# Patient Record
Sex: Female | Born: 1950 | Race: White | Hispanic: No | Smoking: Never smoker
Health system: Southern US, Community
[De-identification: ages and names within clinical notes are randomized; demographics above are authoritative.]

## PROBLEM LIST (undated history)

## (undated) DIAGNOSIS — C499 Malignant neoplasm of connective and soft tissue, unspecified: Secondary | ICD-10-CM

## (undated) DIAGNOSIS — F419 Anxiety disorder, unspecified: Secondary | ICD-10-CM

## (undated) DIAGNOSIS — Z1211 Encounter for screening for malignant neoplasm of colon: Secondary | ICD-10-CM

## (undated) DIAGNOSIS — I499 Cardiac arrhythmia, unspecified: Secondary | ICD-10-CM

## (undated) DIAGNOSIS — E2839 Other primary ovarian failure: Secondary | ICD-10-CM

## (undated) DIAGNOSIS — F32A Depression, unspecified: Secondary | ICD-10-CM

## (undated) DIAGNOSIS — D688 Other specified coagulation defects: Secondary | ICD-10-CM

## (undated) DIAGNOSIS — B009 Herpesviral infection, unspecified: Secondary | ICD-10-CM

## (undated) DIAGNOSIS — I749 Embolism and thrombosis of unspecified artery: Secondary | ICD-10-CM

## (undated) DIAGNOSIS — F329 Major depressive disorder, single episode, unspecified: Secondary | ICD-10-CM

## (undated) DIAGNOSIS — R011 Cardiac murmur, unspecified: Secondary | ICD-10-CM

## (undated) HISTORY — DX: Anxiety disorder, unspecified: F41.9

## (undated) HISTORY — DX: Cardiac arrhythmia, unspecified: I49.9

## (undated) HISTORY — DX: Depression, unspecified: F32.A

## (undated) HISTORY — DX: Cardiac murmur, unspecified: R01.1

## (undated) HISTORY — DX: Encounter for screening for malignant neoplasm of colon: Z12.11

## (undated) HISTORY — DX: Major depressive disorder, single episode, unspecified: F32.9

## (undated) HISTORY — DX: Other specified coagulation defects: D68.8

## (undated) HISTORY — DX: Herpesviral infection, unspecified: B00.9

## (undated) HISTORY — DX: Malignant neoplasm of connective and soft tissue, unspecified: C49.9

## (undated) HISTORY — PX: OTHER SURGICAL HISTORY: SHX169

## (undated) HISTORY — DX: Embolism and thrombosis of unspecified artery: I74.9

## (undated) HISTORY — DX: Other primary ovarian failure: E28.39

## (undated) HISTORY — PX: BREAST SURGERY: SHX581

## (undated) HISTORY — PX: AUGMENTATION MAMMAPLASTY: SUR837

---

## 1993-03-23 HISTORY — PX: VAGINAL HYSTERECTOMY: SUR661

## 1997-07-18 ENCOUNTER — Other Ambulatory Visit: Admission: RE | Admit: 1997-07-18 | Discharge: 1997-07-18 | Payer: Self-pay | Admitting: *Deleted

## 1998-05-30 ENCOUNTER — Other Ambulatory Visit: Admission: RE | Admit: 1998-05-30 | Discharge: 1998-05-30 | Payer: Self-pay | Admitting: *Deleted

## 1999-06-25 ENCOUNTER — Other Ambulatory Visit: Admission: RE | Admit: 1999-06-25 | Discharge: 1999-06-25 | Payer: Self-pay | Admitting: *Deleted

## 1999-07-24 ENCOUNTER — Encounter: Admission: RE | Admit: 1999-07-24 | Discharge: 1999-07-24 | Payer: Self-pay | Admitting: Oncology

## 1999-07-24 ENCOUNTER — Encounter: Payer: Self-pay | Admitting: Oncology

## 1999-08-21 ENCOUNTER — Encounter: Admission: RE | Admit: 1999-08-21 | Discharge: 1999-08-21 | Payer: Self-pay | Admitting: Oncology

## 1999-08-21 ENCOUNTER — Encounter: Payer: Self-pay | Admitting: Oncology

## 2000-06-15 ENCOUNTER — Other Ambulatory Visit: Admission: RE | Admit: 2000-06-15 | Discharge: 2000-06-15 | Payer: Self-pay | Admitting: *Deleted

## 2000-07-20 ENCOUNTER — Encounter: Payer: Self-pay | Admitting: Oncology

## 2000-07-20 ENCOUNTER — Encounter: Admission: RE | Admit: 2000-07-20 | Discharge: 2000-07-20 | Payer: Self-pay | Admitting: Oncology

## 2001-07-06 ENCOUNTER — Encounter: Payer: Self-pay | Admitting: Oncology

## 2001-07-06 ENCOUNTER — Encounter: Admission: RE | Admit: 2001-07-06 | Discharge: 2001-07-06 | Payer: Self-pay | Admitting: Oncology

## 2002-06-13 ENCOUNTER — Other Ambulatory Visit: Admission: RE | Admit: 2002-06-13 | Discharge: 2002-06-13 | Payer: Self-pay | Admitting: Obstetrics and Gynecology

## 2002-10-02 ENCOUNTER — Encounter: Payer: Self-pay | Admitting: Oncology

## 2002-10-02 ENCOUNTER — Encounter: Admission: RE | Admit: 2002-10-02 | Discharge: 2002-10-02 | Payer: Self-pay | Admitting: Oncology

## 2003-07-30 ENCOUNTER — Other Ambulatory Visit: Admission: RE | Admit: 2003-07-30 | Discharge: 2003-07-30 | Payer: Self-pay | Admitting: Gynecology

## 2003-09-25 ENCOUNTER — Encounter: Admission: RE | Admit: 2003-09-25 | Discharge: 2003-09-25 | Payer: Self-pay | Admitting: Oncology

## 2003-12-05 ENCOUNTER — Encounter: Admission: RE | Admit: 2003-12-05 | Discharge: 2003-12-05 | Payer: Self-pay | Admitting: Gastroenterology

## 2003-12-20 ENCOUNTER — Inpatient Hospital Stay (HOSPITAL_COMMUNITY): Admission: EM | Admit: 2003-12-20 | Discharge: 2003-12-24 | Payer: Self-pay | Admitting: *Deleted

## 2003-12-23 ENCOUNTER — Encounter: Payer: Self-pay | Admitting: Oncology

## 2004-01-22 ENCOUNTER — Ambulatory Visit: Payer: Self-pay | Admitting: Oncology

## 2004-03-11 ENCOUNTER — Ambulatory Visit: Payer: Self-pay | Admitting: Oncology

## 2004-03-23 DIAGNOSIS — B009 Herpesviral infection, unspecified: Secondary | ICD-10-CM

## 2004-03-23 HISTORY — DX: Herpesviral infection, unspecified: B00.9

## 2004-05-19 ENCOUNTER — Ambulatory Visit: Payer: Self-pay | Admitting: Oncology

## 2004-07-04 ENCOUNTER — Ambulatory Visit: Payer: Self-pay | Admitting: Oncology

## 2004-08-20 ENCOUNTER — Other Ambulatory Visit: Admission: RE | Admit: 2004-08-20 | Discharge: 2004-08-20 | Payer: Self-pay | Admitting: Gynecology

## 2004-08-28 ENCOUNTER — Ambulatory Visit: Payer: Self-pay | Admitting: Oncology

## 2004-10-22 ENCOUNTER — Ambulatory Visit: Payer: Self-pay | Admitting: Oncology

## 2004-12-29 ENCOUNTER — Ambulatory Visit: Payer: Self-pay | Admitting: Oncology

## 2005-03-03 ENCOUNTER — Ambulatory Visit: Payer: Self-pay | Admitting: Oncology

## 2005-05-25 ENCOUNTER — Ambulatory Visit: Payer: Self-pay | Admitting: Oncology

## 2005-06-02 ENCOUNTER — Ambulatory Visit (HOSPITAL_COMMUNITY): Admission: RE | Admit: 2005-06-02 | Discharge: 2005-06-02 | Payer: Self-pay | Admitting: Oncology

## 2005-06-24 LAB — CBC WITH DIFFERENTIAL/PLATELET
BASO%: 1.8 % (ref 0.0–2.0)
Basophils Absolute: 0.1 10*3/uL (ref 0.0–0.1)
EOS%: 3.9 % (ref 0.0–7.0)
Eosinophils Absolute: 0.2 10*3/uL (ref 0.0–0.5)
HCT: 38.4 % (ref 34.8–46.6)
HGB: 12.8 g/dL (ref 11.6–15.9)
LYMPH%: 39.4 % (ref 14.0–48.0)
MCH: 29.5 pg (ref 26.0–34.0)
MCHC: 33.4 g/dL (ref 32.0–36.0)
MCV: 88.2 fL (ref 81.0–101.0)
MONO#: 0.5 10*3/uL (ref 0.1–0.9)
MONO%: 9.1 % (ref 0.0–13.0)
NEUT#: 2.4 10*3/uL (ref 1.5–6.5)
NEUT%: 45.8 % (ref 39.6–76.8)
Platelets: 185 10*3/uL (ref 145–400)
RBC: 4.35 10*6/uL (ref 3.70–5.32)
RDW: 12.7 % (ref 11.3–14.5)
WBC: 5.1 10*3/uL (ref 3.9–10.0)
lymph#: 2 10*3/uL (ref 0.9–3.3)

## 2005-06-24 LAB — PROTIME-INR
INR: 1.9 (ref 2.00–3.50)
Protime: 16.9 Seconds (ref 10.6–13.4)

## 2005-07-06 LAB — CBC WITH DIFFERENTIAL/PLATELET
BASO%: 1.7 % (ref 0.0–2.0)
Basophils Absolute: 0.1 10*3/uL (ref 0.0–0.1)
EOS%: 3.1 % (ref 0.0–7.0)
Eosinophils Absolute: 0.2 10*3/uL (ref 0.0–0.5)
HCT: 39.4 % (ref 34.8–46.6)
HGB: 13.1 g/dL (ref 11.6–15.9)
LYMPH%: 32.4 % (ref 14.0–48.0)
MCH: 29.4 pg (ref 26.0–34.0)
MCHC: 33.3 g/dL (ref 32.0–36.0)
MCV: 88.1 fL (ref 81.0–101.0)
MONO#: 0.5 10*3/uL (ref 0.1–0.9)
MONO%: 8.8 % (ref 0.0–13.0)
NEUT#: 2.8 10*3/uL (ref 1.5–6.5)
NEUT%: 54.1 % (ref 39.6–76.8)
Platelets: 189 10*3/uL (ref 145–400)
RBC: 4.47 10*6/uL (ref 3.70–5.32)
RDW: 13 % (ref 11.3–14.5)
WBC: 5.2 10*3/uL (ref 3.9–10.0)
lymph#: 1.7 10*3/uL (ref 0.9–3.3)

## 2005-07-06 LAB — PROTIME-INR
INR: 3 (ref 2.00–3.50)
Protime: 20.9 Seconds — ABNORMAL HIGH (ref 10.6–13.4)

## 2005-07-31 ENCOUNTER — Ambulatory Visit: Payer: Self-pay | Admitting: Oncology

## 2005-08-04 LAB — PROTIME-INR
INR: 2.4 (ref 2.00–3.50)
Protime: 18.7 Seconds — ABNORMAL HIGH (ref 10.6–13.4)

## 2005-08-04 LAB — CBC WITH DIFFERENTIAL/PLATELET
BASO%: 1.8 % (ref 0.0–2.0)
Basophils Absolute: 0.1 10*3/uL (ref 0.0–0.1)
EOS%: 4.5 % (ref 0.0–7.0)
Eosinophils Absolute: 0.3 10*3/uL (ref 0.0–0.5)
HCT: 40.5 % (ref 34.8–46.6)
HGB: 13.8 g/dL (ref 11.6–15.9)
LYMPH%: 32.3 % (ref 14.0–48.0)
MCH: 29.7 pg (ref 26.0–34.0)
MCHC: 34 g/dL (ref 32.0–36.0)
MCV: 87.4 fL (ref 81.0–101.0)
MONO#: 0.5 10*3/uL (ref 0.1–0.9)
MONO%: 8 % (ref 0.0–13.0)
NEUT#: 3.2 10*3/uL (ref 1.5–6.5)
NEUT%: 53.3 % (ref 39.6–76.8)
Platelets: 181 10*3/uL (ref 145–400)
RBC: 4.63 10*6/uL (ref 3.70–5.32)
RDW: 12.5 % (ref 11.3–14.5)
WBC: 6.1 10*3/uL (ref 3.9–10.0)
lymph#: 2 10*3/uL (ref 0.9–3.3)

## 2005-08-05 ENCOUNTER — Encounter: Admission: RE | Admit: 2005-08-05 | Discharge: 2005-08-05 | Payer: Self-pay | Admitting: Oncology

## 2005-08-21 ENCOUNTER — Other Ambulatory Visit: Admission: RE | Admit: 2005-08-21 | Discharge: 2005-08-21 | Payer: Self-pay | Admitting: Gynecology

## 2005-09-01 LAB — CBC WITH DIFFERENTIAL/PLATELET
BASO%: 2 % (ref 0.0–2.0)
Basophils Absolute: 0.1 10*3/uL (ref 0.0–0.1)
EOS%: 5.2 % (ref 0.0–7.0)
Eosinophils Absolute: 0.3 10*3/uL (ref 0.0–0.5)
HCT: 41 % (ref 34.8–46.6)
HGB: 13.4 g/dL (ref 11.6–15.9)
LYMPH%: 38.2 % (ref 14.0–48.0)
MCH: 28.4 pg (ref 26.0–34.0)
MCHC: 32.6 g/dL (ref 32.0–36.0)
MCV: 87.3 fL (ref 81.0–101.0)
MONO#: 0.5 10*3/uL (ref 0.1–0.9)
MONO%: 9.5 % (ref 0.0–13.0)
NEUT#: 2.5 10*3/uL (ref 1.5–6.5)
NEUT%: 45.1 % (ref 39.6–76.8)
Platelets: 194 10*3/uL (ref 145–400)
RBC: 4.7 10*6/uL (ref 3.70–5.32)
RDW: 12.4 % (ref 11.3–14.5)
WBC: 5.6 10*3/uL (ref 3.9–10.0)
lymph#: 2.1 10*3/uL (ref 0.9–3.3)

## 2005-09-01 LAB — PROTIME-INR
INR: 1.9 — ABNORMAL LOW (ref 2.00–3.50)
Protime: 16.8 Seconds — ABNORMAL HIGH (ref 10.6–13.4)

## 2005-09-07 LAB — PROTIME-INR
INR: 2 (ref 2.00–3.50)
Protime: 17.6 Seconds — ABNORMAL HIGH (ref 10.6–13.4)

## 2005-09-15 ENCOUNTER — Ambulatory Visit: Payer: Self-pay | Admitting: Oncology

## 2005-09-21 LAB — PROTIME-INR
INR: 1.8 — ABNORMAL LOW (ref 2.00–3.50)
Protime: 16.5 Seconds — ABNORMAL HIGH (ref 10.6–13.4)

## 2005-09-29 LAB — CBC WITH DIFFERENTIAL/PLATELET
BASO%: 1.6 % (ref 0.0–2.0)
Basophils Absolute: 0.1 10*3/uL (ref 0.0–0.1)
EOS%: 3.3 % (ref 0.0–7.0)
Eosinophils Absolute: 0.2 10*3/uL (ref 0.0–0.5)
HCT: 41.4 % (ref 34.8–46.6)
HGB: 13.6 g/dL (ref 11.6–15.9)
LYMPH%: 27.1 % (ref 14.0–48.0)
MCH: 28.7 pg (ref 26.0–34.0)
MCHC: 32.9 g/dL (ref 32.0–36.0)
MCV: 87.3 fL (ref 81.0–101.0)
MONO#: 0.6 10*3/uL (ref 0.1–0.9)
MONO%: 10 % (ref 0.0–13.0)
NEUT#: 3.3 10*3/uL (ref 1.5–6.5)
NEUT%: 58 % (ref 39.6–76.8)
Platelets: 182 10*3/uL (ref 145–400)
RBC: 4.74 10*6/uL (ref 3.70–5.32)
RDW: 12.8 % (ref 11.3–14.5)
WBC: 5.7 10*3/uL (ref 3.9–10.0)
lymph#: 1.6 10*3/uL (ref 0.9–3.3)

## 2005-09-29 LAB — PROTIME-INR
INR: 2.6 (ref 2.00–3.50)
Protime: 19.5 Seconds — ABNORMAL HIGH (ref 10.6–13.4)

## 2005-10-27 LAB — CBC WITH DIFFERENTIAL/PLATELET
BASO%: 1.7 % (ref 0.0–2.0)
Basophils Absolute: 0.1 10*3/uL (ref 0.0–0.1)
EOS%: 3.1 % (ref 0.0–7.0)
Eosinophils Absolute: 0.2 10*3/uL (ref 0.0–0.5)
HCT: 42.5 % (ref 34.8–46.6)
HGB: 13.8 g/dL (ref 11.6–15.9)
LYMPH%: 36.8 % (ref 14.0–48.0)
MCH: 28.4 pg (ref 26.0–34.0)
MCHC: 32.4 g/dL (ref 32.0–36.0)
MCV: 87.9 fL (ref 81.0–101.0)
MONO#: 0.6 10*3/uL (ref 0.1–0.9)
MONO%: 11 % (ref 0.0–13.0)
NEUT#: 2.5 10*3/uL (ref 1.5–6.5)
NEUT%: 47.3 % (ref 39.6–76.8)
Platelets: 195 10*3/uL (ref 145–400)
RBC: 4.83 10*6/uL (ref 3.70–5.32)
RDW: 12.6 % (ref 11.3–14.5)
WBC: 5.3 10*3/uL (ref 3.9–10.0)
lymph#: 2 10*3/uL (ref 0.9–3.3)

## 2005-10-27 LAB — PROTIME-INR
INR: 3 (ref 2.00–3.50)
Protime: 20.9 Seconds (ref 10.6–13.4)

## 2005-11-19 ENCOUNTER — Ambulatory Visit: Payer: Self-pay | Admitting: Oncology

## 2005-11-24 LAB — COMPREHENSIVE METABOLIC PANEL
ALT: 20 U/L (ref 0–40)
AST: 24 U/L (ref 0–37)
Albumin: 4.9 g/dL (ref 3.5–5.2)
Alkaline Phosphatase: 45 U/L (ref 39–117)
BUN: 16 mg/dL (ref 6–23)
CO2: 27 mEq/L (ref 19–32)
Calcium: 9.4 mg/dL (ref 8.4–10.5)
Chloride: 102 mEq/L (ref 96–112)
Creatinine, Ser: 0.6 mg/dL (ref 0.40–1.20)
Glucose, Bld: 100 mg/dL — ABNORMAL HIGH (ref 70–99)
Potassium: 4.1 mEq/L (ref 3.5–5.3)
Sodium: 140 mEq/L (ref 135–145)
Total Bilirubin: 0.8 mg/dL (ref 0.3–1.2)
Total Protein: 7.3 g/dL (ref 6.0–8.3)

## 2005-11-24 LAB — CBC WITH DIFFERENTIAL/PLATELET
BASO%: 0.7 % (ref 0.0–2.0)
Basophils Absolute: 0 10*3/uL (ref 0.0–0.1)
EOS%: 3.3 % (ref 0.0–7.0)
Eosinophils Absolute: 0.2 10*3/uL (ref 0.0–0.5)
HCT: 38.6 % (ref 34.8–46.6)
HGB: 13.1 g/dL (ref 11.6–15.9)
LYMPH%: 37.9 % (ref 14.0–48.0)
MCH: 29.3 pg (ref 26.0–34.0)
MCHC: 33.9 g/dL (ref 32.0–36.0)
MCV: 86.6 fL (ref 81.0–101.0)
MONO#: 0.4 10*3/uL (ref 0.1–0.9)
MONO%: 9.1 % (ref 0.0–13.0)
NEUT#: 2.2 10*3/uL (ref 1.5–6.5)
NEUT%: 49 % (ref 39.6–76.8)
Platelets: 207 10*3/uL (ref 145–400)
RBC: 4.46 10*6/uL (ref 3.70–5.32)
RDW: 13.9 % (ref 11.3–14.5)
WBC: 4.5 10*3/uL (ref 3.9–10.0)
lymph#: 1.7 10*3/uL (ref 0.9–3.3)

## 2005-11-24 LAB — PROTIME-INR
INR: 3.2 (ref 2.00–3.50)
Protime: 38.4 Seconds — ABNORMAL HIGH (ref 10.6–13.4)

## 2005-11-24 LAB — LACTATE DEHYDROGENASE: LDH: 158 U/L (ref 94–250)

## 2005-12-08 LAB — PROTIME-INR
INR: 2.6 (ref 2.00–3.50)
Protime: 31.2 Seconds — ABNORMAL HIGH (ref 10.6–13.4)

## 2005-12-08 LAB — CBC WITH DIFFERENTIAL/PLATELET
BASO%: 1.9 % (ref 0.0–2.0)
Basophils Absolute: 0.1 10*3/uL (ref 0.0–0.1)
EOS%: 3 % (ref 0.0–7.0)
Eosinophils Absolute: 0.2 10*3/uL (ref 0.0–0.5)
HCT: 41 % (ref 34.8–46.6)
HGB: 13.4 g/dL (ref 11.6–15.9)
LYMPH%: 30.4 % (ref 14.0–48.0)
MCH: 28.6 pg (ref 26.0–34.0)
MCHC: 32.6 g/dL (ref 32.0–36.0)
MCV: 87.7 fL (ref 81.0–101.0)
MONO#: 0.5 10*3/uL (ref 0.1–0.9)
MONO%: 10.4 % (ref 0.0–13.0)
NEUT#: 2.9 10*3/uL (ref 1.5–6.5)
NEUT%: 54.3 % (ref 39.6–76.8)
Platelets: 203 10*3/uL (ref 145–400)
RBC: 4.68 10*6/uL (ref 3.70–5.32)
RDW: 12.4 % (ref 11.3–14.5)
WBC: 5.3 10*3/uL (ref 3.9–10.0)
lymph#: 1.6 10*3/uL (ref 0.9–3.3)

## 2006-01-11 ENCOUNTER — Ambulatory Visit: Payer: Self-pay | Admitting: Oncology

## 2006-03-05 ENCOUNTER — Ambulatory Visit: Payer: Self-pay | Admitting: Oncology

## 2006-03-09 LAB — CBC WITH DIFFERENTIAL/PLATELET
BASO%: 1.7 % (ref 0.0–2.0)
Basophils Absolute: 0.1 10*3/uL (ref 0.0–0.1)
EOS%: 2.4 % (ref 0.0–7.0)
Eosinophils Absolute: 0.2 10*3/uL (ref 0.0–0.5)
HCT: 41.7 % (ref 34.8–46.6)
HGB: 13.7 g/dL (ref 11.6–15.9)
LYMPH%: 26.9 % (ref 14.0–48.0)
MCH: 28.3 pg (ref 26.0–34.0)
MCHC: 32.8 g/dL (ref 32.0–36.0)
MCV: 86.4 fL (ref 81.0–101.0)
MONO#: 0.5 10*3/uL (ref 0.1–0.9)
MONO%: 7.3 % (ref 0.0–13.0)
NEUT#: 4.6 10*3/uL (ref 1.5–6.5)
NEUT%: 61.7 % (ref 39.6–76.8)
Platelets: 201 10*3/uL (ref 145–400)
RBC: 4.83 10*6/uL (ref 3.70–5.32)
RDW: 12.1 % (ref 11.3–14.5)
WBC: 7.4 10*3/uL (ref 3.9–10.0)
lymph#: 2 10*3/uL (ref 0.9–3.3)

## 2006-03-09 LAB — PROTIME-INR
INR: 2.4 (ref 2.00–3.50)
Protime: 28.8 Seconds — ABNORMAL HIGH (ref 10.6–13.4)

## 2006-04-29 ENCOUNTER — Ambulatory Visit: Payer: Self-pay | Admitting: Oncology

## 2006-05-04 LAB — CBC WITH DIFFERENTIAL/PLATELET
BASO%: 1.5 % (ref 0.0–2.0)
Basophils Absolute: 0.1 10*3/uL (ref 0.0–0.1)
EOS%: 2.9 % (ref 0.0–7.0)
Eosinophils Absolute: 0.2 10*3/uL (ref 0.0–0.5)
HCT: 39.9 % (ref 34.8–46.6)
HGB: 12.9 g/dL (ref 11.6–15.9)
LYMPH%: 36 % (ref 14.0–48.0)
MCH: 28.3 pg (ref 26.0–34.0)
MCHC: 32.4 g/dL (ref 32.0–36.0)
MCV: 87.2 fL (ref 81.0–101.0)
MONO#: 0.5 10*3/uL (ref 0.1–0.9)
MONO%: 8.7 % (ref 0.0–13.0)
NEUT#: 2.9 10*3/uL (ref 1.5–6.5)
NEUT%: 50.8 % (ref 39.6–76.8)
Platelets: 191 10*3/uL (ref 145–400)
RBC: 4.57 10*6/uL (ref 3.70–5.32)
RDW: 12.3 % (ref 11.3–14.5)
WBC: 5.7 10*3/uL (ref 3.9–10.0)
lymph#: 2 10*3/uL (ref 0.9–3.3)

## 2006-05-04 LAB — PROTIME-INR
INR: 1.8 — ABNORMAL LOW (ref 2.00–3.50)
Protime: 21.6 Seconds — ABNORMAL HIGH (ref 10.6–13.4)

## 2006-05-10 LAB — PROTIME-INR
INR: 2.5 (ref 2.00–3.50)
Protime: 30 Seconds — ABNORMAL HIGH (ref 10.6–13.4)

## 2006-06-01 ENCOUNTER — Ambulatory Visit (HOSPITAL_COMMUNITY): Admission: RE | Admit: 2006-06-01 | Discharge: 2006-06-01 | Payer: Self-pay | Admitting: Oncology

## 2006-06-29 ENCOUNTER — Ambulatory Visit: Payer: Self-pay | Admitting: Oncology

## 2006-06-29 LAB — CBC WITH DIFFERENTIAL/PLATELET
BASO%: 1.5 % (ref 0.0–2.0)
Basophils Absolute: 0.1 10*3/uL (ref 0.0–0.1)
EOS%: 3.1 % (ref 0.0–7.0)
Eosinophils Absolute: 0.2 10*3/uL (ref 0.0–0.5)
HCT: 38.7 % (ref 34.8–46.6)
HGB: 13.1 g/dL (ref 11.6–15.9)
LYMPH%: 38.2 % (ref 14.0–48.0)
MCH: 29 pg (ref 26.0–34.0)
MCHC: 33.8 g/dL (ref 32.0–36.0)
MCV: 85.6 fL (ref 81.0–101.0)
MONO#: 0.5 10*3/uL (ref 0.1–0.9)
MONO%: 8.9 % (ref 0.0–13.0)
NEUT#: 2.7 10*3/uL (ref 1.5–6.5)
NEUT%: 48.4 % (ref 39.6–76.8)
Platelets: 196 10*3/uL (ref 145–400)
RBC: 4.52 10*6/uL (ref 3.70–5.32)
RDW: 12.1 % (ref 11.3–14.5)
WBC: 5.5 10*3/uL (ref 3.9–10.0)
lymph#: 2.1 10*3/uL (ref 0.9–3.3)

## 2006-06-29 LAB — PROTIME-INR
INR: 2.3 (ref 2.00–3.50)
Protime: 27.6 Seconds — ABNORMAL HIGH (ref 10.6–13.4)

## 2006-08-09 ENCOUNTER — Encounter: Admission: RE | Admit: 2006-08-09 | Discharge: 2006-08-09 | Payer: Self-pay | Admitting: Oncology

## 2006-08-11 ENCOUNTER — Encounter: Admission: RE | Admit: 2006-08-11 | Discharge: 2006-08-11 | Payer: Self-pay | Admitting: Oncology

## 2006-08-20 ENCOUNTER — Ambulatory Visit: Payer: Self-pay | Admitting: Oncology

## 2006-08-23 ENCOUNTER — Other Ambulatory Visit: Admission: RE | Admit: 2006-08-23 | Discharge: 2006-08-23 | Payer: Self-pay | Admitting: Gynecology

## 2006-08-24 LAB — PROTIME-INR
INR: 2.2 (ref 2.00–3.50)
Protime: 26.4 Seconds — ABNORMAL HIGH (ref 10.6–13.4)

## 2006-08-24 LAB — CBC WITH DIFFERENTIAL/PLATELET
BASO%: 1.4 % (ref 0.0–2.0)
Basophils Absolute: 0.1 10*3/uL (ref 0.0–0.1)
EOS%: 1.4 % (ref 0.0–7.0)
Eosinophils Absolute: 0.1 10*3/uL (ref 0.0–0.5)
HCT: 40.2 % (ref 34.8–46.6)
HGB: 13.4 g/dL (ref 11.6–15.9)
LYMPH%: 31.1 % (ref 14.0–48.0)
MCH: 28.6 pg (ref 26.0–34.0)
MCHC: 33.2 g/dL (ref 32.0–36.0)
MCV: 86 fL (ref 81.0–101.0)
MONO#: 0.5 10*3/uL (ref 0.1–0.9)
MONO%: 8.5 % (ref 0.0–13.0)
NEUT#: 3.4 10*3/uL (ref 1.5–6.5)
NEUT%: 57.7 % (ref 39.6–76.8)
Platelets: 173 10*3/uL (ref 145–400)
RBC: 4.68 10*6/uL (ref 3.70–5.32)
RDW: 11.9 % (ref 11.3–14.5)
WBC: 5.8 10*3/uL (ref 3.9–10.0)
lymph#: 1.8 10*3/uL (ref 0.9–3.3)

## 2006-10-14 ENCOUNTER — Ambulatory Visit: Payer: Self-pay | Admitting: Oncology

## 2006-10-19 LAB — CBC WITH DIFFERENTIAL/PLATELET
BASO%: 2 % (ref 0.0–2.0)
Basophils Absolute: 0.1 10*3/uL (ref 0.0–0.1)
EOS%: 3.7 % (ref 0.0–7.0)
Eosinophils Absolute: 0.2 10*3/uL (ref 0.0–0.5)
HCT: 40.8 % (ref 34.8–46.6)
HGB: 13.4 g/dL (ref 11.6–15.9)
LYMPH%: 34.8 % (ref 14.0–48.0)
MCH: 28.4 pg (ref 26.0–34.0)
MCHC: 32.8 g/dL (ref 32.0–36.0)
MCV: 86.5 fL (ref 81.0–101.0)
MONO#: 0.5 10*3/uL (ref 0.1–0.9)
MONO%: 11.1 % (ref 0.0–13.0)
NEUT#: 2.3 10*3/uL (ref 1.5–6.5)
NEUT%: 48.3 % (ref 39.6–76.8)
Platelets: 175 10*3/uL (ref 145–400)
RBC: 4.72 10*6/uL (ref 3.70–5.32)
RDW: 12.1 % (ref 11.3–14.5)
WBC: 4.8 10*3/uL (ref 3.9–10.0)
lymph#: 1.7 10*3/uL (ref 0.9–3.3)

## 2006-10-19 LAB — PROTIME-INR
INR: 2.6 (ref 2.00–3.50)
Protime: 31.2 Seconds — ABNORMAL HIGH (ref 10.6–13.4)

## 2006-11-23 LAB — CBC WITH DIFFERENTIAL/PLATELET
BASO%: 0.4 % (ref 0.0–2.0)
Basophils Absolute: 0 10*3/uL (ref 0.0–0.1)
EOS%: 1.3 % (ref 0.0–7.0)
Eosinophils Absolute: 0.1 10*3/uL (ref 0.0–0.5)
HCT: 38.3 % (ref 34.8–46.6)
HGB: 13.2 g/dL (ref 11.6–15.9)
LYMPH%: 36 % (ref 14.0–48.0)
MCH: 29.3 pg (ref 26.0–34.0)
MCHC: 34.4 g/dL (ref 32.0–36.0)
MCV: 85 fL (ref 81.0–101.0)
MONO#: 0.5 10*3/uL (ref 0.1–0.9)
MONO%: 9.7 % (ref 0.0–13.0)
NEUT#: 2.8 10*3/uL (ref 1.5–6.5)
NEUT%: 52.6 % (ref 39.6–76.8)
Platelets: 190 10*3/uL (ref 145–400)
RBC: 4.51 10*6/uL (ref 3.70–5.32)
RDW: 13.6 % (ref 11.3–14.5)
WBC: 5.3 10*3/uL (ref 3.9–10.0)
lymph#: 1.9 10*3/uL (ref 0.9–3.3)

## 2006-11-23 LAB — COMPREHENSIVE METABOLIC PANEL
ALT: 16 U/L (ref 0–35)
AST: 19 U/L (ref 0–37)
Albumin: 5 g/dL (ref 3.5–5.2)
Alkaline Phosphatase: 45 U/L (ref 39–117)
BUN: 14 mg/dL (ref 6–23)
CO2: 26 mEq/L (ref 19–32)
Calcium: 9.6 mg/dL (ref 8.4–10.5)
Chloride: 103 mEq/L (ref 96–112)
Creatinine, Ser: 0.82 mg/dL (ref 0.40–1.20)
Glucose, Bld: 84 mg/dL (ref 70–99)
Potassium: 4.4 mEq/L (ref 3.5–5.3)
Sodium: 141 mEq/L (ref 135–145)
Total Bilirubin: 0.8 mg/dL (ref 0.3–1.2)
Total Protein: 7.5 g/dL (ref 6.0–8.3)

## 2006-11-23 LAB — PROTIME-INR
INR: 2.6 (ref 2.00–3.50)
Protime: 31.2 Seconds — ABNORMAL HIGH (ref 10.6–13.4)

## 2006-11-23 LAB — LACTATE DEHYDROGENASE: LDH: 162 U/L (ref 94–250)

## 2006-11-26 ENCOUNTER — Ambulatory Visit: Payer: Self-pay | Admitting: Oncology

## 2007-01-21 ENCOUNTER — Ambulatory Visit: Payer: Self-pay | Admitting: Oncology

## 2007-01-25 LAB — CBC WITH DIFFERENTIAL/PLATELET
BASO%: 2 % (ref 0.0–2.0)
Basophils Absolute: 0.1 10*3/uL (ref 0.0–0.1)
EOS%: 2.3 % (ref 0.0–7.0)
Eosinophils Absolute: 0.1 10*3/uL (ref 0.0–0.5)
HCT: 39.4 % (ref 34.8–46.6)
HGB: 13.3 g/dL (ref 11.6–15.9)
LYMPH%: 34.3 % (ref 14.0–48.0)
MCH: 29.4 pg (ref 26.0–34.0)
MCHC: 33.8 g/dL (ref 32.0–36.0)
MCV: 86.9 fL (ref 81.0–101.0)
MONO#: 0.5 10*3/uL (ref 0.1–0.9)
MONO%: 10.3 % (ref 0.0–13.0)
NEUT#: 2.6 10*3/uL (ref 1.5–6.5)
NEUT%: 51.2 % (ref 39.6–76.8)
Platelets: 169 10*3/uL (ref 145–400)
RBC: 4.53 10*6/uL (ref 3.70–5.32)
RDW: 11.7 % (ref 11.3–14.5)
WBC: 5.1 10*3/uL (ref 3.9–10.0)
lymph#: 1.7 10*3/uL (ref 0.9–3.3)

## 2007-01-25 LAB — PROTIME-INR
INR: 2.3 (ref 2.00–3.50)
Protime: 27.6 Seconds — ABNORMAL HIGH (ref 10.6–13.4)

## 2007-02-07 ENCOUNTER — Ambulatory Visit: Payer: Self-pay | Admitting: Vascular Surgery

## 2007-03-07 LAB — PROTIME-INR
INR: 2 (ref 2.00–3.50)
Protime: 24 Seconds — ABNORMAL HIGH (ref 10.6–13.4)

## 2007-03-15 ENCOUNTER — Ambulatory Visit: Payer: Self-pay | Admitting: Oncology

## 2007-03-21 LAB — PROTIME-INR
INR: 2.6 (ref 2.00–3.50)
Protime: 31.2 Seconds — ABNORMAL HIGH (ref 10.6–13.4)

## 2007-04-18 ENCOUNTER — Encounter: Admission: RE | Admit: 2007-04-18 | Discharge: 2007-04-18 | Payer: Self-pay | Admitting: Orthopedic Surgery

## 2007-04-18 LAB — CBC WITH DIFFERENTIAL/PLATELET
BASO%: 1.4 % (ref 0.0–2.0)
Basophils Absolute: 0.1 10*3/uL (ref 0.0–0.1)
EOS%: 3.4 % (ref 0.0–7.0)
Eosinophils Absolute: 0.2 10*3/uL (ref 0.0–0.5)
HCT: 39 % (ref 34.8–46.6)
HGB: 13.2 g/dL (ref 11.6–15.9)
LYMPH%: 28 % (ref 14.0–48.0)
MCH: 28.9 pg (ref 26.0–34.0)
MCHC: 33.7 g/dL (ref 32.0–36.0)
MCV: 85.7 fL (ref 81.0–101.0)
MONO#: 0.5 10*3/uL (ref 0.1–0.9)
MONO%: 7 % (ref 0.0–13.0)
NEUT#: 3.9 10*3/uL (ref 1.5–6.5)
NEUT%: 60.2 % (ref 39.6–76.8)
Platelets: 165 10*3/uL (ref 145–400)
RBC: 4.55 10*6/uL (ref 3.70–5.32)
RDW: 11.4 % (ref 11.3–14.5)
WBC: 6.5 10*3/uL (ref 3.9–10.0)
lymph#: 1.8 10*3/uL (ref 0.9–3.3)

## 2007-04-18 LAB — PROTIME-INR
INR: 3 (ref 2.00–3.50)
Protime: 36 Seconds — ABNORMAL HIGH (ref 10.6–13.4)

## 2007-06-09 ENCOUNTER — Ambulatory Visit: Payer: Self-pay | Admitting: Oncology

## 2007-06-13 LAB — PROTIME-INR
INR: 2 (ref 2.00–3.50)
Protime: 24 Seconds — ABNORMAL HIGH (ref 10.6–13.4)

## 2007-06-13 LAB — CBC WITH DIFFERENTIAL/PLATELET
BASO%: 1.4 % (ref 0.0–2.0)
Basophils Absolute: 0.1 10*3/uL (ref 0.0–0.1)
EOS%: 3.7 % (ref 0.0–7.0)
Eosinophils Absolute: 0.2 10*3/uL (ref 0.0–0.5)
HCT: 41.5 % (ref 34.8–46.6)
HGB: 13.4 g/dL (ref 11.6–15.9)
LYMPH%: 31.9 % (ref 14.0–48.0)
MCH: 28.8 pg (ref 26.0–34.0)
MCHC: 32.3 g/dL (ref 32.0–36.0)
MCV: 89.3 fL (ref 81.0–101.0)
MONO#: 0.5 10*3/uL (ref 0.1–0.9)
MONO%: 7 % (ref 0.0–13.0)
NEUT#: 3.6 10*3/uL (ref 1.5–6.5)
NEUT%: 56 % (ref 39.6–76.8)
Platelets: 189 10*3/uL (ref 145–400)
RBC: 4.66 10*6/uL (ref 3.70–5.32)
RDW: 13.3 % (ref 11.3–14.5)
WBC: 6.4 10*3/uL (ref 3.9–10.0)
lymph#: 2.1 10*3/uL (ref 0.9–3.3)

## 2007-08-09 ENCOUNTER — Ambulatory Visit: Payer: Self-pay | Admitting: Oncology

## 2007-08-17 ENCOUNTER — Encounter: Admission: RE | Admit: 2007-08-17 | Discharge: 2007-08-17 | Payer: Self-pay | Admitting: Oncology

## 2007-08-26 ENCOUNTER — Other Ambulatory Visit: Admission: RE | Admit: 2007-08-26 | Discharge: 2007-08-26 | Payer: Self-pay | Admitting: Gynecology

## 2007-09-09 LAB — CBC WITH DIFFERENTIAL/PLATELET
BASO%: 1 % (ref 0.0–2.0)
Basophils Absolute: 0 10*3/uL (ref 0.0–0.1)
EOS%: 3 % (ref 0.0–7.0)
Eosinophils Absolute: 0.1 10*3/uL (ref 0.0–0.5)
HCT: 38.7 % (ref 34.8–46.6)
HGB: 13 g/dL (ref 11.6–15.9)
LYMPH%: 36.8 % (ref 14.0–48.0)
MCH: 28.7 pg (ref 26.0–34.0)
MCHC: 33.7 g/dL (ref 32.0–36.0)
MCV: 85.2 fL (ref 81.0–101.0)
MONO#: 0.5 10*3/uL (ref 0.1–0.9)
MONO%: 11.5 % (ref 0.0–13.0)
NEUT#: 2 10*3/uL (ref 1.5–6.5)
NEUT%: 47.7 % (ref 39.6–76.8)
Platelets: 206 10*3/uL (ref 145–400)
RBC: 4.54 10*6/uL (ref 3.70–5.32)
RDW: 14.1 % (ref 11.3–14.5)
WBC: 4.3 10*3/uL (ref 3.9–10.0)
lymph#: 1.6 10*3/uL (ref 0.9–3.3)

## 2007-09-09 LAB — COMPREHENSIVE METABOLIC PANEL
ALT: 18 U/L (ref 0–35)
AST: 22 U/L (ref 0–37)
Albumin: 5.2 g/dL (ref 3.5–5.2)
Alkaline Phosphatase: 46 U/L (ref 39–117)
BUN: 10 mg/dL (ref 6–23)
CO2: 23 mEq/L (ref 19–32)
Calcium: 9.6 mg/dL (ref 8.4–10.5)
Chloride: 105 mEq/L (ref 96–112)
Creatinine, Ser: 0.66 mg/dL (ref 0.40–1.20)
Glucose, Bld: 95 mg/dL (ref 70–99)
Potassium: 4.8 mEq/L (ref 3.5–5.3)
Sodium: 141 mEq/L (ref 135–145)
Total Bilirubin: 0.8 mg/dL (ref 0.3–1.2)
Total Protein: 7.8 g/dL (ref 6.0–8.3)

## 2007-09-09 LAB — PROTIME-INR
INR: 2.5 (ref 2.00–3.50)
Protime: 30 Seconds — ABNORMAL HIGH (ref 10.6–13.4)

## 2007-09-09 LAB — LACTATE DEHYDROGENASE: LDH: 174 U/L (ref 94–250)

## 2007-10-28 ENCOUNTER — Ambulatory Visit: Payer: Self-pay | Admitting: Oncology

## 2007-12-23 ENCOUNTER — Ambulatory Visit: Payer: Self-pay | Admitting: Oncology

## 2007-12-27 LAB — CBC WITH DIFFERENTIAL/PLATELET
BASO%: 1.7 % (ref 0.0–2.0)
Basophils Absolute: 0.1 10*3/uL (ref 0.0–0.1)
EOS%: 4.6 % (ref 0.0–7.0)
Eosinophils Absolute: 0.3 10*3/uL (ref 0.0–0.5)
HCT: 41.6 % (ref 34.8–46.6)
HGB: 13.5 g/dL (ref 11.6–15.9)
LYMPH%: 33.5 % (ref 14.0–48.0)
MCH: 28.5 pg (ref 26.0–34.0)
MCHC: 32.5 g/dL (ref 32.0–36.0)
MCV: 87.8 fL (ref 81.0–101.0)
MONO#: 0.5 10*3/uL (ref 0.1–0.9)
MONO%: 9.6 % (ref 0.0–13.0)
NEUT#: 2.8 10*3/uL (ref 1.5–6.5)
NEUT%: 50.5 % (ref 39.6–76.8)
Platelets: 170 10*3/uL (ref 145–400)
RBC: 4.73 10*6/uL (ref 3.70–5.32)
RDW: 12.7 % (ref 11.3–14.5)
WBC: 5.5 10*3/uL (ref 3.9–10.0)
lymph#: 1.8 10*3/uL (ref 0.9–3.3)

## 2007-12-27 LAB — PROTIME-INR
INR: 2.5 (ref 2.00–3.50)
Protime: 30 Seconds — ABNORMAL HIGH (ref 10.6–13.4)

## 2008-02-07 ENCOUNTER — Ambulatory Visit: Payer: Self-pay | Admitting: Gynecology

## 2008-02-10 ENCOUNTER — Ambulatory Visit: Payer: Self-pay | Admitting: Vascular Surgery

## 2008-02-14 ENCOUNTER — Ambulatory Visit: Payer: Self-pay | Admitting: Vascular Surgery

## 2008-02-15 ENCOUNTER — Ambulatory Visit: Payer: Self-pay | Admitting: Oncology

## 2008-02-21 LAB — PROTIME-INR
INR: 1.7 — ABNORMAL LOW (ref 2.00–3.50)
Protime: 20.4 Seconds — ABNORMAL HIGH (ref 10.6–13.4)

## 2008-03-13 ENCOUNTER — Ambulatory Visit: Payer: Self-pay | Admitting: Gynecology

## 2008-04-13 ENCOUNTER — Ambulatory Visit: Payer: Self-pay | Admitting: Oncology

## 2008-04-17 LAB — PROTIME-INR
INR: 1.8 — ABNORMAL LOW (ref 2.00–3.50)
Protime: 21.6 Seconds — ABNORMAL HIGH (ref 10.6–13.4)

## 2008-04-20 LAB — PROTIME-INR
INR: 2 (ref 2.00–3.50)
Protime: 24 Seconds — ABNORMAL HIGH (ref 10.6–13.4)

## 2008-04-23 LAB — PROTIME-INR
INR: 2.5 (ref 2.00–3.50)
Protime: 30 Seconds — ABNORMAL HIGH (ref 10.6–13.4)

## 2008-05-04 LAB — PROTIME-INR
INR: 1.9 — ABNORMAL LOW (ref 2.00–3.50)
Protime: 22.8 Seconds — ABNORMAL HIGH (ref 10.6–13.4)

## 2008-05-14 LAB — CBC WITH DIFFERENTIAL/PLATELET
BASO%: 1 % (ref 0.0–2.0)
Basophils Absolute: 0.1 10*3/uL (ref 0.0–0.1)
EOS%: 1.9 % (ref 0.0–7.0)
Eosinophils Absolute: 0.1 10*3/uL (ref 0.0–0.5)
HCT: 41 % (ref 34.8–46.6)
HGB: 13.6 g/dL (ref 11.6–15.9)
LYMPH%: 31.3 % (ref 14.0–49.7)
MCH: 29.1 pg (ref 25.1–34.0)
MCHC: 33.2 g/dL (ref 31.5–36.0)
MCV: 87.5 fL (ref 79.5–101.0)
MONO#: 0.5 10*3/uL (ref 0.1–0.9)
MONO%: 9.6 % (ref 0.0–14.0)
NEUT#: 2.9 10*3/uL (ref 1.5–6.5)
NEUT%: 56.2 % (ref 38.4–76.8)
Platelets: 202 10*3/uL (ref 145–400)
RBC: 4.69 10*6/uL (ref 3.70–5.45)
RDW: 13.8 % (ref 11.2–14.5)
WBC: 5.1 10*3/uL (ref 3.9–10.3)
lymph#: 1.6 10*3/uL (ref 0.9–3.3)

## 2008-05-14 LAB — URINALYSIS, MICROSCOPIC - CHCC
Bilirubin (Urine): NEGATIVE
Glucose: NEGATIVE g/dL
Ketones: NEGATIVE mg/dL
Nitrite: NEGATIVE
Protein: NEGATIVE mg/dL
Specific Gravity, Urine: 1.005 (ref 1.003–1.035)
pH: 7.5 (ref 4.6–8.0)

## 2008-05-14 LAB — COMPREHENSIVE METABOLIC PANEL
ALT: 18 U/L (ref 0–35)
AST: 23 U/L (ref 0–37)
Albumin: 5.2 g/dL (ref 3.5–5.2)
Alkaline Phosphatase: 44 U/L (ref 39–117)
BUN: 13 mg/dL (ref 6–23)
CO2: 26 mEq/L (ref 19–32)
Calcium: 9.8 mg/dL (ref 8.4–10.5)
Chloride: 103 mEq/L (ref 96–112)
Creatinine, Ser: 0.7 mg/dL (ref 0.40–1.20)
Glucose, Bld: 100 mg/dL — ABNORMAL HIGH (ref 70–99)
Potassium: 4.5 mEq/L (ref 3.5–5.3)
Sodium: 140 mEq/L (ref 135–145)
Total Bilirubin: 0.6 mg/dL (ref 0.3–1.2)
Total Protein: 7.8 g/dL (ref 6.0–8.3)

## 2008-05-14 LAB — PROTIME-INR
INR: 2.5 (ref 2.00–3.50)
Protime: 30 Seconds — ABNORMAL HIGH (ref 10.6–13.4)

## 2008-05-14 LAB — LACTATE DEHYDROGENASE: LDH: 188 U/L (ref 94–250)

## 2008-05-25 LAB — PROTIME-INR
INR: 2.3 (ref 2.00–3.50)
Protime: 27.6 Seconds — ABNORMAL HIGH (ref 10.6–13.4)

## 2008-05-29 LAB — AMYLASE: Amylase: 54 U/L (ref 0–105)

## 2008-05-29 LAB — LIPASE: Lipase: 30 U/L (ref 0–75)

## 2008-05-29 LAB — CHROMOGRANIN A: Chromogranin A: <1.5 ng/mL (ref <=36.4)

## 2008-05-30 ENCOUNTER — Encounter: Admission: RE | Admit: 2008-05-30 | Discharge: 2008-05-30 | Payer: Self-pay | Admitting: Oncology

## 2008-06-19 ENCOUNTER — Ambulatory Visit: Payer: Self-pay | Admitting: Oncology

## 2008-06-22 LAB — PROTIME-INR
INR: 2 (ref 2.00–3.50)
Protime: 24 Seconds — ABNORMAL HIGH (ref 10.6–13.4)

## 2008-07-13 LAB — PROTIME-INR
INR: 1.4 — ABNORMAL LOW (ref 2.00–3.50)
Protime: 16.8 Seconds — ABNORMAL HIGH (ref 10.6–13.4)

## 2008-08-16 ENCOUNTER — Ambulatory Visit: Payer: Self-pay | Admitting: Oncology

## 2008-08-17 ENCOUNTER — Encounter: Admission: RE | Admit: 2008-08-17 | Discharge: 2008-08-17 | Payer: Self-pay | Admitting: Oncology

## 2008-08-21 LAB — CBC WITH DIFFERENTIAL/PLATELET
BASO%: 0.8 % (ref 0.0–2.0)
Basophils Absolute: 0 10*3/uL (ref 0.0–0.1)
EOS%: 2 % (ref 0.0–7.0)
Eosinophils Absolute: 0.1 10*3/uL (ref 0.0–0.5)
HCT: 39.3 % (ref 34.8–46.6)
HGB: 13.3 g/dL (ref 11.6–15.9)
LYMPH%: 29.3 % (ref 14.0–49.7)
MCH: 28.4 pg (ref 25.1–34.0)
MCHC: 33.8 g/dL (ref 31.5–36.0)
MCV: 84 fL (ref 79.5–101.0)
MONO#: 0.5 10*3/uL (ref 0.1–0.9)
MONO%: 10.3 % (ref 0.0–14.0)
NEUT#: 2.9 10*3/uL (ref 1.5–6.5)
NEUT%: 57.6 % (ref 38.4–76.8)
Platelets: 151 10*3/uL (ref 145–400)
RBC: 4.68 10*6/uL (ref 3.70–5.45)
RDW: 13.3 % (ref 11.2–14.5)
WBC: 5 10*3/uL (ref 3.9–10.3)
lymph#: 1.5 10*3/uL (ref 0.9–3.3)

## 2008-08-21 LAB — PROTIME-INR
INR: 2.3 (ref 2.00–3.50)
Protime: 27.6 Seconds — ABNORMAL HIGH (ref 10.6–13.4)

## 2008-08-27 ENCOUNTER — Other Ambulatory Visit: Admission: RE | Admit: 2008-08-27 | Discharge: 2008-08-27 | Payer: Self-pay | Admitting: Gynecology

## 2008-08-27 ENCOUNTER — Encounter: Payer: Self-pay | Admitting: Gynecology

## 2008-08-27 ENCOUNTER — Ambulatory Visit: Payer: Self-pay | Admitting: Gynecology

## 2008-09-07 LAB — COMPREHENSIVE METABOLIC PANEL
ALT: 16 U/L (ref 0–35)
AST: 23 U/L (ref 0–37)
Albumin: 4.6 g/dL (ref 3.5–5.2)
Alkaline Phosphatase: 47 U/L (ref 39–117)
BUN: 10 mg/dL (ref 6–23)
CO2: 28 mEq/L (ref 19–32)
Calcium: 9.3 mg/dL (ref 8.4–10.5)
Chloride: 104 mEq/L (ref 96–112)
Creatinine, Ser: 0.69 mg/dL (ref 0.40–1.20)
Glucose, Bld: 99 mg/dL (ref 70–99)
Potassium: 4.6 mEq/L (ref 3.5–5.3)
Sodium: 140 mEq/L (ref 135–145)
Total Bilirubin: 0.6 mg/dL (ref 0.3–1.2)
Total Protein: 7.2 g/dL (ref 6.0–8.3)

## 2008-09-07 LAB — CBC WITH DIFFERENTIAL/PLATELET
BASO%: 1.3 % (ref 0.0–2.0)
Basophils Absolute: 0.1 10*3/uL (ref 0.0–0.1)
EOS%: 2.2 % (ref 0.0–7.0)
Eosinophils Absolute: 0.1 10*3/uL (ref 0.0–0.5)
HCT: 36.3 % (ref 34.8–46.6)
HGB: 12.5 g/dL (ref 11.6–15.9)
LYMPH%: 37.1 % (ref 14.0–49.7)
MCH: 30 pg (ref 25.1–34.0)
MCHC: 34.5 g/dL (ref 31.5–36.0)
MCV: 86.9 fL (ref 79.5–101.0)
MONO#: 0.4 10*3/uL (ref 0.1–0.9)
MONO%: 9.8 % (ref 0.0–14.0)
NEUT#: 2.3 10*3/uL (ref 1.5–6.5)
NEUT%: 49.6 % (ref 38.4–76.8)
Platelets: 188 10*3/uL (ref 145–400)
RBC: 4.17 10*6/uL (ref 3.70–5.45)
RDW: 13.9 % (ref 11.2–14.5)
WBC: 4.6 10*3/uL (ref 3.9–10.3)
lymph#: 1.7 10*3/uL (ref 0.9–3.3)

## 2008-09-07 LAB — LACTATE DEHYDROGENASE: LDH: 176 U/L (ref 94–250)

## 2008-09-19 ENCOUNTER — Ambulatory Visit: Payer: Self-pay | Admitting: Oncology

## 2008-09-28 LAB — PROTIME-INR
INR: 1.8 — ABNORMAL LOW (ref 2.00–3.50)
Protime: 21.6 Seconds — ABNORMAL HIGH (ref 10.6–13.4)

## 2008-10-24 ENCOUNTER — Ambulatory Visit: Payer: Self-pay | Admitting: Oncology

## 2008-10-26 LAB — PROTIME-INR
INR: 2.6 (ref 2.00–3.50)
Protime: 31.2 Seconds — ABNORMAL HIGH (ref 10.6–13.4)

## 2008-11-27 ENCOUNTER — Ambulatory Visit: Payer: Self-pay | Admitting: Oncology

## 2008-11-29 LAB — CBC WITH DIFFERENTIAL/PLATELET
BASO%: 1.3 % (ref 0.0–2.0)
Basophils Absolute: 0.1 10*3/uL (ref 0.0–0.1)
EOS%: 3.2 % (ref 0.0–7.0)
Eosinophils Absolute: 0.2 10*3/uL (ref 0.0–0.5)
HCT: 39.4 % (ref 34.8–46.6)
HGB: 13.2 g/dL (ref 11.6–15.9)
LYMPH%: 39.2 % (ref 14.0–49.7)
MCH: 28.5 pg (ref 25.1–34.0)
MCHC: 33.5 g/dL (ref 31.5–36.0)
MCV: 85.1 fL (ref 79.5–101.0)
MONO#: 0.5 10*3/uL (ref 0.1–0.9)
MONO%: 10.8 % (ref 0.0–14.0)
NEUT#: 2.1 10*3/uL (ref 1.5–6.5)
NEUT%: 45.5 % (ref 38.4–76.8)
Platelets: 181 10*3/uL (ref 145–400)
RBC: 4.63 10*6/uL (ref 3.70–5.45)
RDW: 13.4 % (ref 11.2–14.5)
WBC: 4.6 10*3/uL (ref 3.9–10.3)
lymph#: 1.8 10*3/uL (ref 0.9–3.3)
nRBC: 0 % (ref 0–0)

## 2008-11-29 LAB — PROTIME-INR
INR: 2.8 (ref 2.00–3.50)
Protime: 33.6 Seconds — ABNORMAL HIGH (ref 10.6–13.4)

## 2008-12-27 ENCOUNTER — Ambulatory Visit: Payer: Self-pay | Admitting: Oncology

## 2008-12-27 LAB — PROTIME-INR
INR: 2.8 (ref 2.00–3.50)
Protime: 33.6 Seconds — ABNORMAL HIGH (ref 10.6–13.4)

## 2009-02-05 ENCOUNTER — Ambulatory Visit: Payer: Self-pay | Admitting: Oncology

## 2009-02-07 LAB — PROTIME-INR
INR: 2.4 (ref 2.00–3.50)
Protime: 28.8 Seconds — ABNORMAL HIGH (ref 10.6–13.4)

## 2009-03-25 ENCOUNTER — Ambulatory Visit: Payer: Self-pay | Admitting: Oncology

## 2009-03-28 LAB — PROTIME-INR
INR: 1.7 — ABNORMAL LOW (ref 2.00–3.50)
Protime: 20.4 Seconds — ABNORMAL HIGH (ref 10.6–13.4)

## 2009-03-28 LAB — CBC WITH DIFFERENTIAL/PLATELET
BASO%: 1.2 % (ref 0.0–2.0)
Basophils Absolute: 0.1 10*3/uL (ref 0.0–0.1)
EOS%: 3.1 % (ref 0.0–7.0)
Eosinophils Absolute: 0.2 10*3/uL (ref 0.0–0.5)
HCT: 42.4 % (ref 34.8–46.6)
HGB: 13.9 g/dL (ref 11.6–15.9)
LYMPH%: 38.1 % (ref 14.0–49.7)
MCH: 29 pg (ref 25.1–34.0)
MCHC: 32.8 g/dL (ref 31.5–36.0)
MCV: 88.5 fL (ref 79.5–101.0)
MONO#: 0.4 10*3/uL (ref 0.1–0.9)
MONO%: 7.9 % (ref 0.0–14.0)
NEUT#: 2.6 10*3/uL (ref 1.5–6.5)
NEUT%: 49.7 % (ref 38.4–76.8)
Platelets: 174 10*3/uL (ref 145–400)
RBC: 4.79 10*6/uL (ref 3.70–5.45)
RDW: 12.9 % (ref 11.2–14.5)
WBC: 5.2 10*3/uL (ref 3.9–10.3)
lymph#: 2 10*3/uL (ref 0.9–3.3)
nRBC: 0 % (ref 0–0)

## 2009-04-09 LAB — PROTIME-INR
INR: 1.7 — ABNORMAL LOW (ref 2.00–3.50)
Protime: 20.4 Seconds — ABNORMAL HIGH (ref 10.6–13.4)

## 2009-04-19 LAB — PROTIME-INR
INR: 3.2 (ref 2.00–3.50)
Protime: 38.4 Seconds — ABNORMAL HIGH (ref 10.6–13.4)

## 2009-04-30 ENCOUNTER — Ambulatory Visit: Payer: Self-pay | Admitting: Gynecology

## 2009-05-14 ENCOUNTER — Ambulatory Visit: Payer: Self-pay | Admitting: Oncology

## 2009-05-14 ENCOUNTER — Ambulatory Visit: Payer: Self-pay | Admitting: Gynecology

## 2009-05-16 LAB — PROTIME-INR
INR: 1.6 — ABNORMAL LOW (ref 2.00–3.50)
Protime: 19.2 Seconds — ABNORMAL HIGH (ref 10.6–13.4)

## 2009-05-30 LAB — PROTIME-INR
INR: 1.6 — ABNORMAL LOW (ref 2.00–3.50)
Protime: 19.2 Seconds — ABNORMAL HIGH (ref 10.6–13.4)

## 2009-06-07 LAB — PROTIME-INR
INR: 2.4 (ref 2.00–3.50)
Protime: 28.8 Seconds — ABNORMAL HIGH (ref 10.6–13.4)

## 2009-06-21 ENCOUNTER — Ambulatory Visit: Payer: Self-pay | Admitting: Oncology

## 2009-06-25 LAB — PROTIME-INR
INR: 2.4 (ref 2.00–3.50)
Protime: 28.8 Seconds — ABNORMAL HIGH (ref 10.6–13.4)

## 2009-08-06 ENCOUNTER — Ambulatory Visit: Payer: Self-pay | Admitting: Oncology

## 2009-08-06 LAB — PROTIME-INR
INR: 2.7 (ref 2.00–3.50)
Protime: 32.4 Seconds — ABNORMAL HIGH (ref 10.6–13.4)

## 2009-09-06 ENCOUNTER — Ambulatory Visit: Payer: Self-pay | Admitting: Oncology

## 2009-09-06 LAB — CBC WITH DIFFERENTIAL/PLATELET
BASO%: 0.6 % (ref 0.0–2.0)
Basophils Absolute: 0 10*3/uL (ref 0.0–0.1)
EOS%: 2.7 % (ref 0.0–7.0)
Eosinophils Absolute: 0.1 10*3/uL (ref 0.0–0.5)
HCT: 38.8 % (ref 34.8–46.6)
HGB: 13.3 g/dL (ref 11.6–15.9)
LYMPH%: 31.8 % (ref 14.0–49.7)
MCH: 29.7 pg (ref 25.1–34.0)
MCHC: 34.1 g/dL (ref 31.5–36.0)
MCV: 87 fL (ref 79.5–101.0)
MONO#: 0.4 10*3/uL (ref 0.1–0.9)
MONO%: 8.9 % (ref 0.0–14.0)
NEUT#: 2.7 10*3/uL (ref 1.5–6.5)
NEUT%: 56 % (ref 38.4–76.8)
Platelets: 203 10*3/uL (ref 145–400)
RBC: 4.46 10*6/uL (ref 3.70–5.45)
RDW: 13.9 % (ref 11.2–14.5)
WBC: 4.8 10*3/uL (ref 3.9–10.3)
lymph#: 1.5 10*3/uL (ref 0.9–3.3)

## 2009-09-06 LAB — COMPREHENSIVE METABOLIC PANEL
ALT: 15 U/L (ref 0–35)
AST: 22 U/L (ref 0–37)
Albumin: 4.9 g/dL (ref 3.5–5.2)
Alkaline Phosphatase: 54 U/L (ref 39–117)
BUN: 16 mg/dL (ref 6–23)
CO2: 25 mEq/L (ref 19–32)
Calcium: 10.1 mg/dL (ref 8.4–10.5)
Chloride: 103 mEq/L (ref 96–112)
Creatinine, Ser: 0.69 mg/dL (ref 0.40–1.20)
Glucose, Bld: 145 mg/dL — ABNORMAL HIGH (ref 70–99)
Potassium: 4.4 mEq/L (ref 3.5–5.3)
Sodium: 141 mEq/L (ref 135–145)
Total Bilirubin: 0.9 mg/dL (ref 0.3–1.2)
Total Protein: 7.6 g/dL (ref 6.0–8.3)

## 2009-09-06 LAB — PROTIME-INR
INR: 2.6 (ref 2.00–3.50)
Protime: 31.2 Seconds — ABNORMAL HIGH (ref 10.6–13.4)

## 2009-09-06 LAB — LACTATE DEHYDROGENASE: LDH: 180 U/L (ref 94–250)

## 2009-10-29 ENCOUNTER — Ambulatory Visit: Payer: Self-pay | Admitting: Oncology

## 2009-10-31 LAB — PROTIME-INR
INR: 1.8 — ABNORMAL LOW (ref 2.00–3.50)
Protime: 21.6 Seconds — ABNORMAL HIGH (ref 10.6–13.4)

## 2009-10-31 LAB — BASIC METABOLIC PANEL
BUN: 13 mg/dL (ref 6–23)
CO2: 26 mEq/L (ref 19–32)
Calcium: 9.5 mg/dL (ref 8.4–10.5)
Chloride: 104 mEq/L (ref 96–112)
Creatinine, Ser: 0.68 mg/dL (ref 0.40–1.20)
Glucose, Bld: 101 mg/dL — ABNORMAL HIGH (ref 70–99)
Potassium: 4.8 mEq/L (ref 3.5–5.3)
Sodium: 140 mEq/L (ref 135–145)

## 2009-11-28 ENCOUNTER — Ambulatory Visit: Payer: Self-pay | Admitting: Oncology

## 2009-11-28 LAB — PROTIME-INR
INR: 2.3 (ref 2.00–3.50)
Protime: 27.6 Seconds — ABNORMAL HIGH (ref 10.6–13.4)

## 2010-01-22 ENCOUNTER — Ambulatory Visit: Payer: Self-pay | Admitting: Oncology

## 2010-01-24 LAB — PROTIME-INR
INR: 1.7 — ABNORMAL LOW (ref 2.00–3.50)
Protime: 20.4 Seconds — ABNORMAL HIGH (ref 10.6–13.4)

## 2010-01-31 ENCOUNTER — Ambulatory Visit: Payer: Self-pay | Admitting: Women's Health

## 2010-02-05 IMAGING — CT CT PELVIS W/ CM
2 of 5 series · 17 of 46 positions shown, 19 images · IV contrast (READICAT/WATER & [ID] OMNI 300)
Comparison: None

CT ABDOMEN

CLINICAL DATA: Unexplained diarrhea and weight loss.

CT ABDOMEN AND PELVIS WITH CONTRAST
TECHNIQUE: Multidetector CT imaging of the abdomen and pelvis was
performed using the standard protocol following bolus
administration of intravenous contrast.
Contrast: 100 ml of 3mnipaque-GXX

[Series 2: a&p w/ · axial · 0.59mm/px · z∈[-356,-6]mm · 14 of 80 slices shown, 16 images]
[im 5/80  soft-tissue]
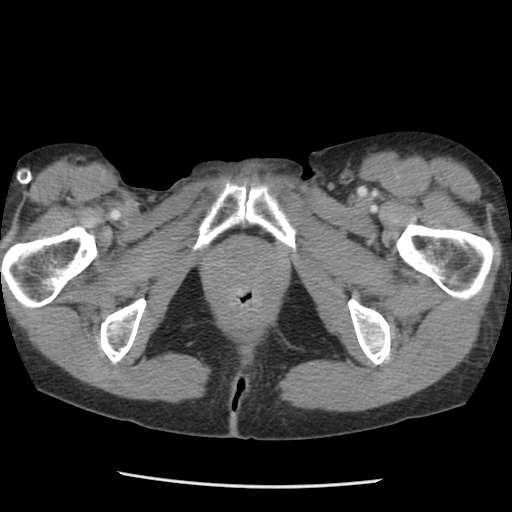
[im 5/80  bone]
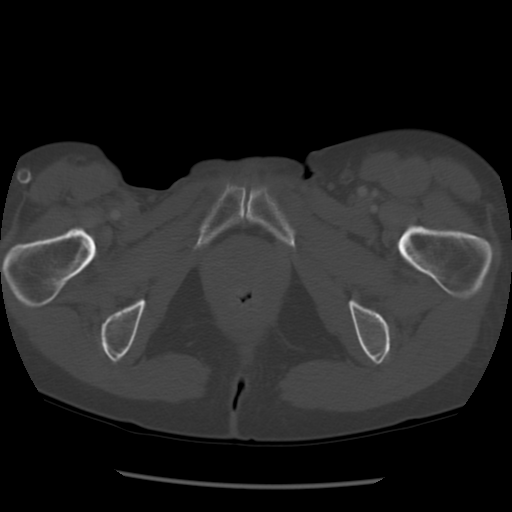
[im 10/80  soft-tissue]
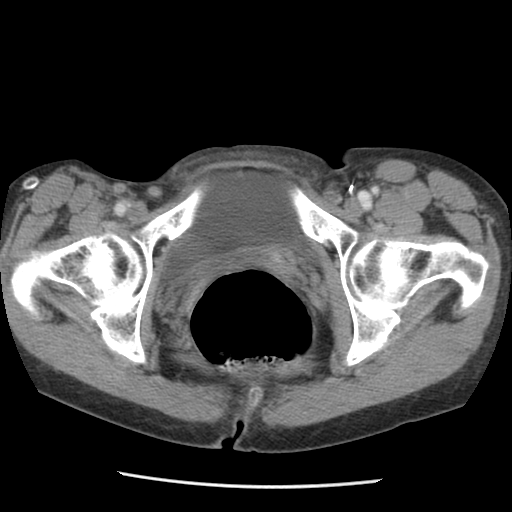
[im 14/80  soft-tissue]
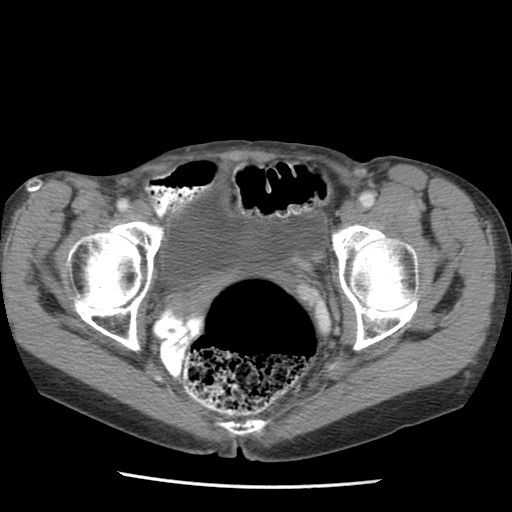
[im 24/80  soft-tissue]
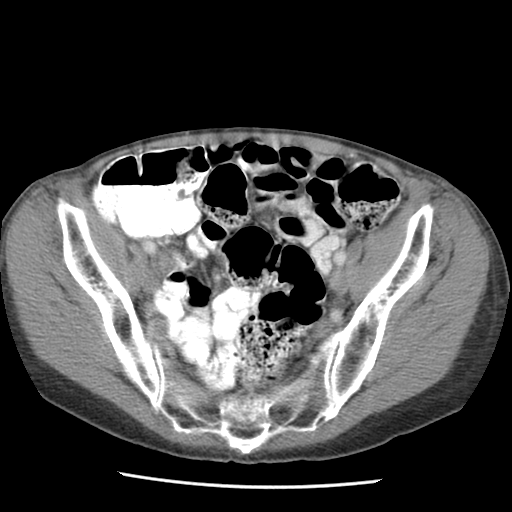
[im 28/80  soft-tissue]
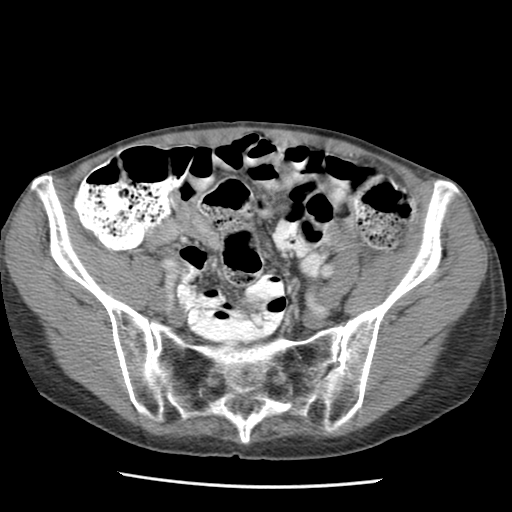
[im 33/80  soft-tissue]
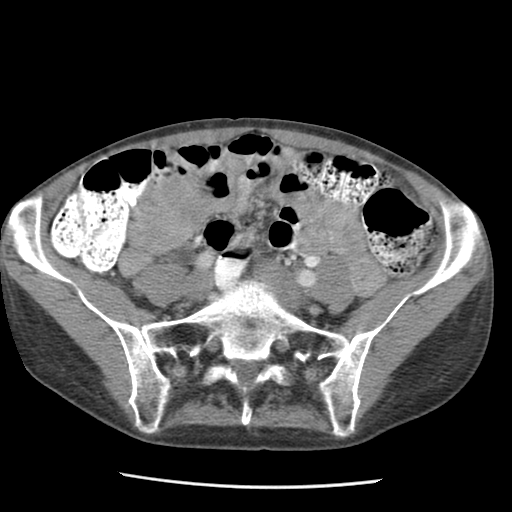
[im 38/80  soft-tissue]
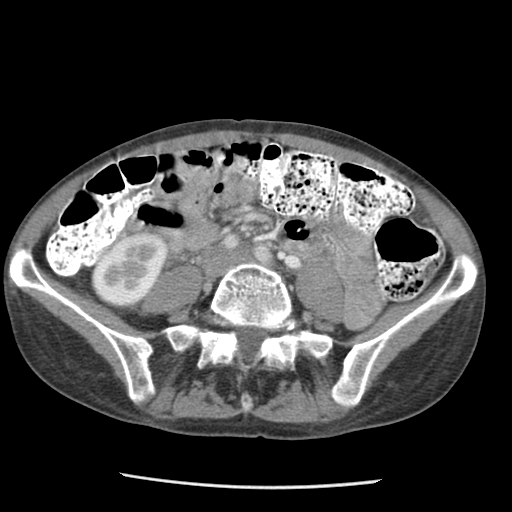
[im 42/80  soft-tissue]
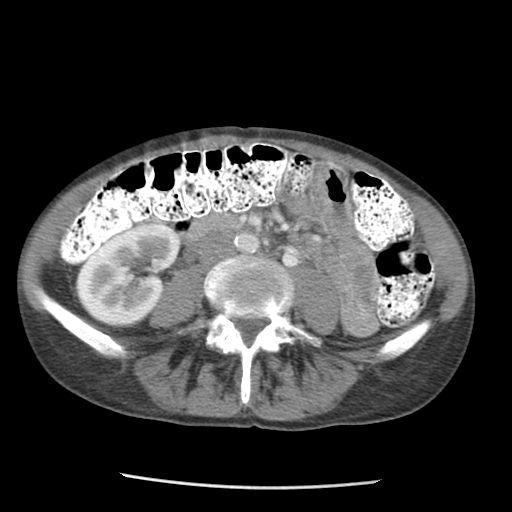
[im 47/80  soft-tissue]
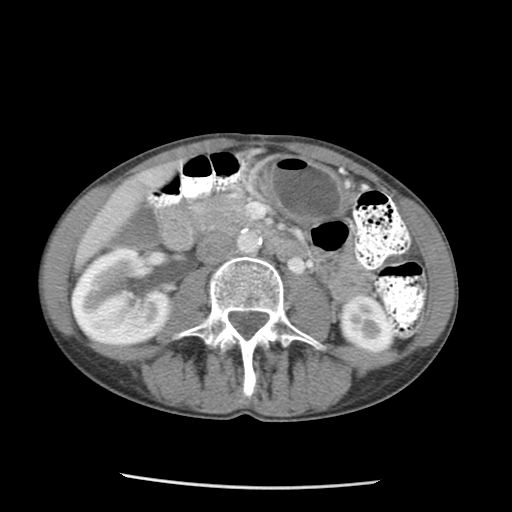
[im 47/80  bone]
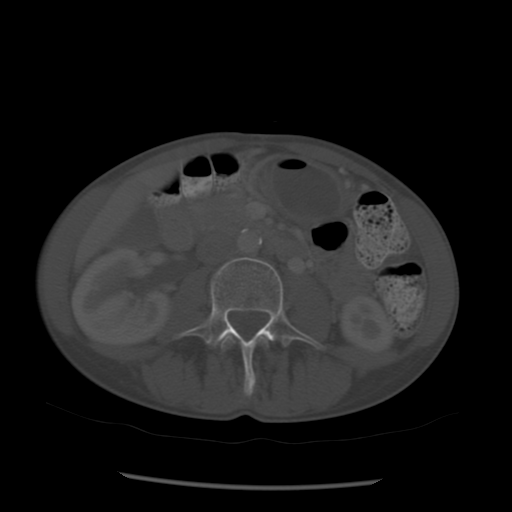
[im 52/80  soft-tissue]
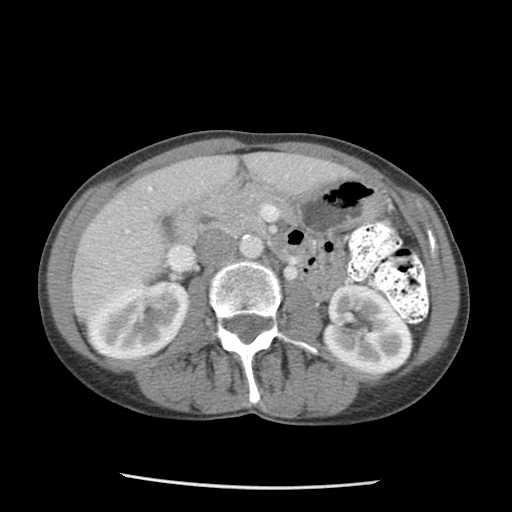
[im 61/80  soft-tissue]
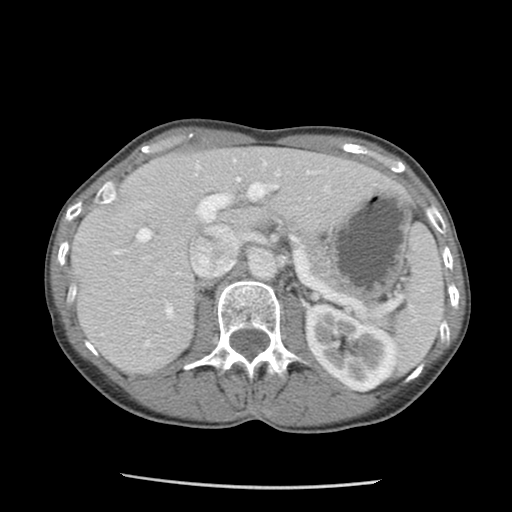
[im 66/80  soft-tissue]
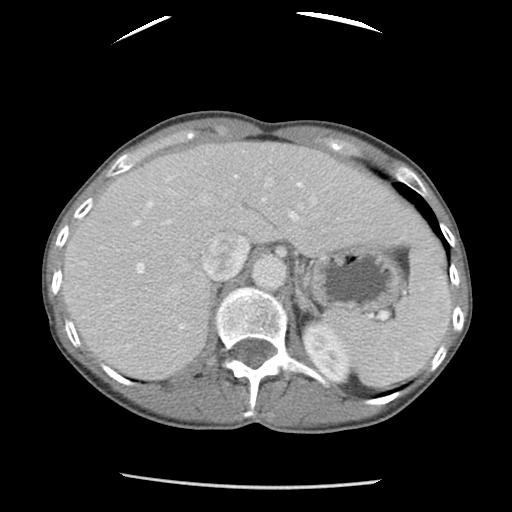
[im 70/80  soft-tissue]
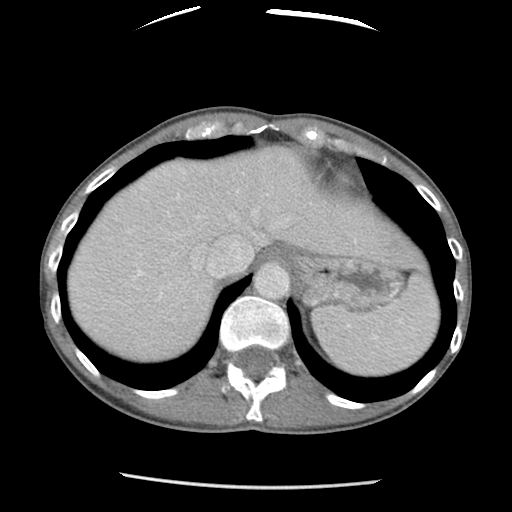
[im 75/80  soft-tissue]
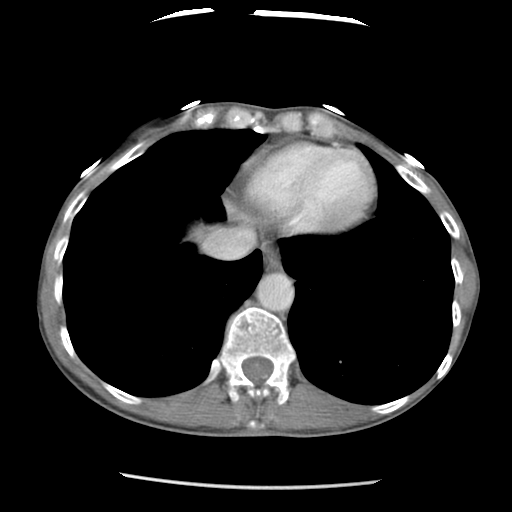

[Series 400: cor abd · coronal · 0.82mm/px · 3 of 79 slices shown]
[im 27/79  soft-tissue]
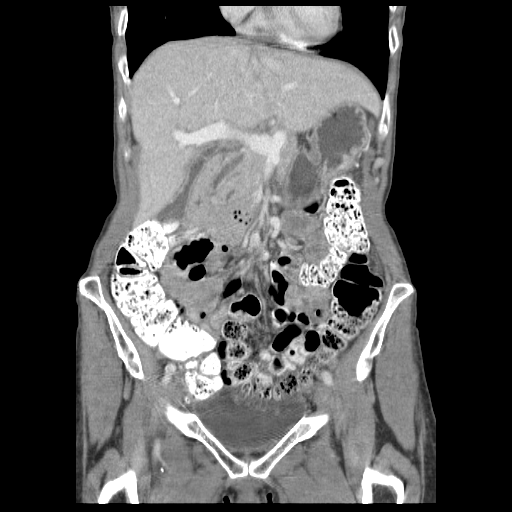
[im 35/79  soft-tissue]
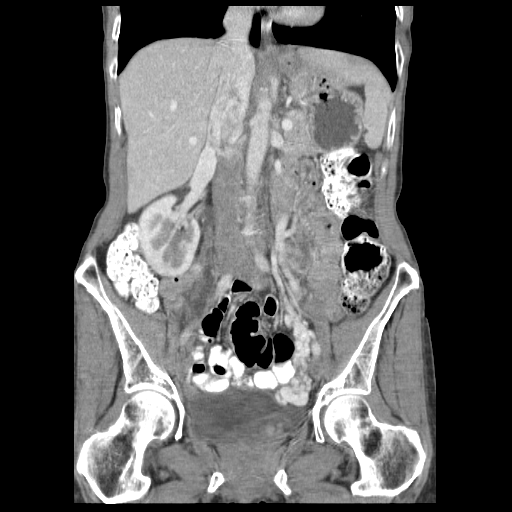
[im 44/79  soft-tissue]
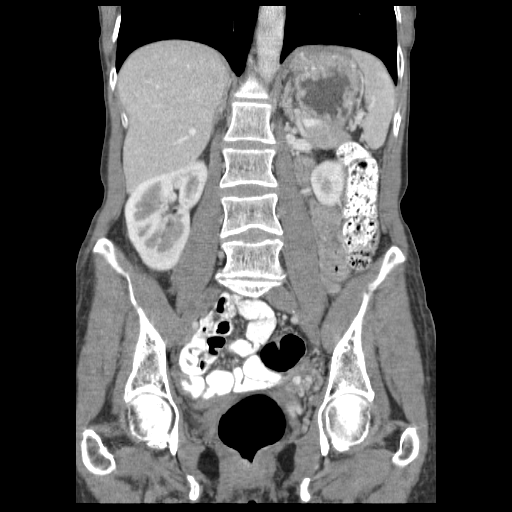

[17 of 46 positions shown; findings below may reference images not displayed]

FINDINGS: The lung bases are clear.

There is a small low attenuation lesion in the liver which is
stable since the prior chest CT from 5998.  This is likely a benign
cyst.  No biliary dilatation.  The gallbladder appears normal.  The
spleen is normal in size.  The pancreas is unremarkable.  The
adrenal glands and kidneys are normal.

The stomach, duodenum, small bowel and colon demonstrate no
significant findings.

No mesenteric or retroperitoneal masses or adenopathy.  The aorta
is normal in caliber.  No dissection.  The major branch vessels are
normal.  Mild atherosclerotic changes are noted.

No significant bony findings.
IMPRESSION: 1.  No acute abdominal findings, mass lesions or adenopathy.
2.  Stable benign appearing liver cyst.
3.  Mild atherosclerotic changes involving the aorta.

CT PELVIS
FINDINGS: The left ovarian vein is dilated and is a large tangle
of parametria L vessels in the left pelvis.  These findings are
suggestive of pelvic congestion syndrome.  The rectum, sigmoid
colon and visualized small bowel loops are unremarkable.  The the
patient has had a hysterectomy.  Both ovaries appear to be still
present and unremarkable by CT.

Surgical changes are noted in the groin regions bilaterally. Intact
bony pelvis.
IMPRESSION: 1.  Findings suggestive of pelvic congestion syndrome with dilated
left parametrial vessels and left ovarian vein.

2.  Status post hysterectomy.  Both ovaries appear to be still
present and are unremarkable.
3.  No pelvic masses, adenopathy or free pelvic fluid collections.

## 2010-02-11 LAB — PROTIME-INR
INR: 2.3 (ref 2.00–3.50)
Protime: 27.6 Seconds — ABNORMAL HIGH (ref 10.6–13.4)

## 2010-03-11 ENCOUNTER — Encounter
Admission: RE | Admit: 2010-03-11 | Discharge: 2010-03-11 | Payer: Self-pay | Source: Home / Self Care | Attending: Gynecology | Admitting: Gynecology

## 2010-04-04 ENCOUNTER — Ambulatory Visit: Payer: Self-pay | Admitting: Oncology

## 2010-04-08 LAB — PROTIME-INR
INR: 1.9 — ABNORMAL LOW (ref 2.00–3.50)
Protime: 22.8 Seconds — ABNORMAL HIGH (ref 10.6–13.4)

## 2010-04-13 ENCOUNTER — Encounter: Payer: Self-pay | Admitting: Gynecology

## 2010-04-13 ENCOUNTER — Encounter: Payer: Self-pay | Admitting: Oncology

## 2010-04-13 ENCOUNTER — Encounter: Payer: Self-pay | Admitting: Gastroenterology

## 2010-05-20 ENCOUNTER — Encounter (HOSPITAL_BASED_OUTPATIENT_CLINIC_OR_DEPARTMENT_OTHER): Payer: PRIVATE HEALTH INSURANCE | Admitting: Oncology

## 2010-05-20 ENCOUNTER — Other Ambulatory Visit: Payer: Self-pay | Admitting: Oncology

## 2010-05-20 DIAGNOSIS — Z86718 Personal history of other venous thrombosis and embolism: Secondary | ICD-10-CM

## 2010-05-20 DIAGNOSIS — Z8589 Personal history of malignant neoplasm of other organs and systems: Secondary | ICD-10-CM

## 2010-05-20 LAB — PROTIME-INR
INR: 2 (ref 2.00–3.50)
Protime: 24 Seconds — ABNORMAL HIGH (ref 10.6–13.4)

## 2010-05-20 LAB — CBC WITH DIFFERENTIAL/PLATELET
BASO%: 1.1 % (ref 0.0–2.0)
Basophils Absolute: 0.1 10*3/uL (ref 0.0–0.1)
EOS%: 2.7 % (ref 0.0–7.0)
Eosinophils Absolute: 0.1 10*3/uL (ref 0.0–0.5)
HCT: 39.2 % (ref 34.8–46.6)
HGB: 13 g/dL (ref 11.6–15.9)
LYMPH%: 34.8 % (ref 14.0–49.7)
MCH: 28.3 pg (ref 25.1–34.0)
MCHC: 33.2 g/dL (ref 31.5–36.0)
MCV: 85.4 fL (ref 79.5–101.0)
MONO#: 0.4 10*3/uL (ref 0.1–0.9)
MONO%: 9.5 % (ref 0.0–14.0)
NEUT#: 2.3 10*3/uL (ref 1.5–6.5)
NEUT%: 51.9 % (ref 38.4–76.8)
Platelets: 178 10*3/uL (ref 145–400)
RBC: 4.59 10*6/uL (ref 3.70–5.45)
RDW: 13.6 % (ref 11.2–14.5)
WBC: 4.5 10*3/uL (ref 3.9–10.3)
lymph#: 1.6 10*3/uL (ref 0.9–3.3)
nRBC: 0 % (ref 0–0)

## 2010-06-10 ENCOUNTER — Encounter (HOSPITAL_BASED_OUTPATIENT_CLINIC_OR_DEPARTMENT_OTHER): Payer: PRIVATE HEALTH INSURANCE | Admitting: Oncology

## 2010-06-10 ENCOUNTER — Other Ambulatory Visit: Payer: Self-pay | Admitting: Oncology

## 2010-06-10 DIAGNOSIS — Z8589 Personal history of malignant neoplasm of other organs and systems: Secondary | ICD-10-CM

## 2010-06-10 DIAGNOSIS — Z86718 Personal history of other venous thrombosis and embolism: Secondary | ICD-10-CM

## 2010-06-10 LAB — PROTIME-INR
INR: 2.6 (ref 2.00–3.50)
Protime: 31.2 Seconds — ABNORMAL HIGH (ref 10.6–13.4)

## 2010-08-05 ENCOUNTER — Other Ambulatory Visit: Payer: Self-pay | Admitting: Oncology

## 2010-08-05 ENCOUNTER — Encounter (HOSPITAL_BASED_OUTPATIENT_CLINIC_OR_DEPARTMENT_OTHER): Payer: PRIVATE HEALTH INSURANCE | Admitting: Oncology

## 2010-08-05 DIAGNOSIS — Z8589 Personal history of malignant neoplasm of other organs and systems: Secondary | ICD-10-CM

## 2010-08-05 DIAGNOSIS — Z7901 Long term (current) use of anticoagulants: Secondary | ICD-10-CM

## 2010-08-05 DIAGNOSIS — Z86718 Personal history of other venous thrombosis and embolism: Secondary | ICD-10-CM

## 2010-08-05 LAB — PROTIME-INR
INR: 1.3 — ABNORMAL LOW (ref 2.00–3.50)
Protime: 15.6 Seconds — ABNORMAL HIGH (ref 10.6–13.4)

## 2010-08-05 NOTE — Procedures (Signed)
BYPASS GRAFT EVALUATION   INDICATION:  Follow-up evaluation of right lower extremity bypass graft.   HISTORY:  Diabetes:  No.  Cardiac:  No.  Hypertension:  No.  Smoking:  No.  Previous Surgery:  Urokinase infusion into an occluded right iliofemoral  popliteal bypass graft composed of Gore-Tex on December 08, 1993, by  Dr. Hart Rochester.   SINGLE LEVEL ARTERIAL EXAM                               RIGHT              LEFT  Brachial:                    113                113  Anterior tibial:             87                 139  Posterior tibial:            102                144  Peroneal:  Ankle/brachial index:        0.90               >1.0   PREVIOUS ABI:  Date: 02/07/2007  RIGHT:  >1.0  LEFT:  >1.0   LOWER EXTREMITY BYPASS GRAFT DUPLEX EXAM:   DUPLEX:  The Gore-Tex graft is occluded.  No flow is identified within.  The right common femoral, superficial femoral and popliteal arteries  appear to be patent with biphasic flow by Doppler analysis.   IMPRESSION:  1. Right ABI is lower than previously recorded.  2. Left ABI is stable.  3. Occluded right iliofemoral popliteal bypass graft.  4. Patient does not report any rest pain or claudication in the right      leg.   ___________________________________________  Quita Skye. Hart Rochester, M.D.   MC/MEDQ  D:  02/10/2008  T:  02/10/2008  Job:  [id number]

## 2010-08-05 NOTE — Procedures (Signed)
BYPASS GRAFT EVALUATION   INDICATION:  Follow-up right leg bypass graft  The patient is asymptomatic   HISTORY:  Diabetes:  No  Cardiac:  No  Hypertension:  No  Smoking:  No  Previous Surgery:  Urokinase infusion into occluded right iliofemoral to  popliteal artery Gore-Tex bypass graft on December 08, 1993 by Dr.  Hart Rochester   SINGLE LEVEL ARTERIAL EXAM                               RIGHT              LEFT  Brachial:                    114                110  Anterior tibial:             110                124  Posterior tibial:            122                130  Peroneal:  Ankle/brachial index:        >1.0               >1.0   PREVIOUS ABI:  Date: February 04, 2006  RIGHT:  >1.0.  LEFT:  >1.0.   LOWER EXTREMITY BYPASS GRAFT DUPLEX EXAM:   DUPLEX:  Doppler arterial waveforms are biphasic proximal to, throughout  and distal to the graft  Velocities are within normal limits.   IMPRESSION:  1. Patent right iliofemoral to popliteal artery bypass graft.  2. Ankle-brachial indices are stable bilaterally.   ___________________________________________  Quita Skye Hart Rochester, M.D.   DP/MEDQ  D:  02/07/2007  T:  02/08/2007  Job:  [id number]

## 2010-08-05 NOTE — Assessment & Plan Note (Signed)
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  She also had a vein graft  from the left leg placed which failed early thereby the Gore-Tex graft  was placed.  I had followed her in the vascular lab for the last 20  years for patency of the graft and it has remained patent and apparently  did have a pulse when seen by Dr. Cyndie Chime in July of 2009.  When she  came to the vascular lab last week the graft was occluded but  interestingly she has an ABI of 0.9 and no symptoms of claudication and  a palpable femoral popliteal and dorsalis pedis pulse in the right leg.  I am not sure I understand this since she had resection of the right  superficial femoral artery and vein and supposedly the saphenous vein  graft failed.  She must have excellent collaterals but is having no  symptoms and requires no treatment at this time.   I discussed all of this with her at length.  Today her blood pressure is  124/76 and heart rate 80, respirations 16.  Both feet are well-perfused.  She has excellent femoral pulses bilaterally with a 2+ right popliteal  and 2+ dorsalis pedis on the right and a 3+ on the left.  ABIs 0.9 on  the right and 1.1 on the left.  She will return to see me in 1 year with  ABIs and a scan of the right leg.  We can consider discontinuing her  Coumadin at that time since the Gore-Tex is thrombosed.  I hate to  discontinue it now since I really do not know when the Gore-Tex occluded  and whether it is helpful in her native circulation patency.   Natalie Skye  Hart Reyes, M.D.  Electronically Signed   JDL/MEDQ  D:  02/14/2008  T:  02/15/2008  Job:  [id number]

## 2010-08-08 NOTE — Discharge Summary (Signed)
NAMEMARION, SEESE NO.:  [id number]   MEDICAL RECORD NO.:  [id number]          PATIENT TYPE:  INP   LOCATION:  2103                         FACILITY:  [facility]   PHYSICIAN:  Genene Churn. Granfortuna, M.D.DATE OF BIRTH:  Apr 09, 1950   DATE OF ADMISSION:  12/21/2003  DATE OF DISCHARGE:  12/24/2003                                 DISCHARGE SUMMARY   Mrs. 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  She required extensive  arterial vascular graft to the lower extremity.  She underwent chemotherapy  and radiation therapy.  She has had no evidence for recurrent sarcoma.  She  has had problems with thrombosis of the arterial graft occurring in January,  February, and September of 1995 requiring embolectomy procedures and  thrombolytic therapy.  She has had no subsequent thrombotic episodes.  Full  dose Coumadin anticoagulation over the last 10 years.   She underwent a bilateral breast implant procedure two days prior to the  current admission.  Coumadin was stopped for two days prior to surgery.  INR  down to 1.8 on the day prior to surgery.  She was started on Lovenox with  plan to keep anticoagulation tight around the procedure to avoid  rethrombosis of her arterial graft.  Surgery proceeded without incident.  She presented the next day with dizziness and weakness.  She was initially  felt to have had a delayed reaction to the anesthetics used for the  procedure.  She was given approximately six liters of saline in the  surgeon's office.  Her symptoms persisted.  She was seen in our office for a  scheduled dose of Lovenox.  A CBC was done and showed a dramatic fall in her  hemoglobin from her baseline of 13 g down to 4 g.  She was admitted on an  urgent basis for further evaluation and treatment.   On examination, blood pressure was 104/54, pulse 96, temperature 99.2.  There was an elastic bandage  covering the entire chest wall.  There was no  obvious bleeding at the surgical incision site underneath each areolar area.  However, there was massive hematoma formation tracking down the lateral  chest wall to the flanks.   A CT scan was done.  This showed large hematomas along both breast  prostheses approximately 10 x 5 cm bilaterally.  Small bilateral effusions.  Some subcutaneous emphysema consistent with the recent surgical procedure.  No evidence for any thoracic intra-abdominal or retroperitoneal hematoma.  Right-sided femoral popliteal graft was noted.   HOSPITAL COURSE:  Oral anticoagulation was stopped.  She was given blood  transfusions.  She required a total of about 5 units of blood over the next  72 hours.  Hemoglobin finally stabilized at 9 g and remained stable x24  hours.  At that point, Coumadin anticoagulation was cautiously resumed.  A  single dose of Lovenox 1 mg/kg was given on the day of discharge.   She did have a fever which was most likely due to the breakdown of the  massive  hematoma.  She was covered with antibiotics.  Cultures remained  sterile.   There was a transient fall in her platelet count from admission value of  206,000 to 100,000 on the day after admission.  I was concerned with  possibility of early DIC.  However, a fibrinogen and a D-dimer were both  normal.  Fibrinogen 485, D-dimer 0.8 (actually D-dimer slightly elevated but  this is compatible with recent surgery).  Repeat platelet count on the same  day was up to 154,000 and by discharge, over 200,000 again.   She was followed closely by her plastic surgeon.  Decision was made not to  attempt drainage of the breast hematomas.  She was seen in consultation by  cardiovascular surgery, Dr. Donata Clay.  There was not felt to be any  significant intrathoracic hematoma and surgical intervention was deferred.   Initial INR was 2.4 in our office but when repeated at [facility]  Emergency  Department it was 3.5.  Vitamin K 2 mg was given IV.  The next day  the INR was 2.4.  An additional 1 mg of vitamin K was given and subsequent  INR on the same day, December 21, 2003, was down into the normal range of  1.5 and remained  normal for the remainder of the hospital stay.  Initial  PTT was 52 and subsequently normalized to 37.   Blood type was A, Rh positive, antibody screen negative.   CONSULTATIONS:  1.  Plastic surgery.  2.  Cardiothoracic surgery.   PROCEDURE:  CT scan of chest, abdomen and pelvis.   DIAGNOSES:  1.  Acute hemorrhage due to inadvertant over anticoagulation.  2.  Status post bilateral breast implants.  3.  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  4.  Right femoral popliteal vascular graft done in association with cancer      surgery.  5.  History of recurrent thrombosis of right arterial graft, none since      1995.  6.  Reactive depression.   DISPOSITION:  Condition stable at the time of discharge.  She is advised to  stay at home until the situation is more stable.  She will resume Coumadin 7  mg daily.  She will be assessed in our office tomorrow.  Continue on a  regular diet.       JMG/MEDQ  D:  12/24/2003  T:  12/24/2003  Job:  [id number]   cc:   Earvin Hansen L. Shon Hough, M.D.  [address]  [place]  [place] [address]  Fax: [phone]   Mikey Bussing, M.D.  [address]  [place]  [place] [address]   Candyce Churn, M.D.  [address] [place]  [place] [address]  Fax: [phone]

## 2010-08-08 NOTE — H&P (Signed)
NAMEBRYNDLE, Natalie Reyes              ACCOUNT NO.:  [id number]   MEDICAL RECORD NO.:  [id number]          PATIENT TYPE:  EMS   LOCATION:  ED                           [facility]ACILITY:  [facility]   PHYSICIAN:  Deirdre Peer. Polite, M.D. DATE OF BIRTH:  Feb 20, 1951   DATE OF ADMISSION:  12/20/2003  DATE OF DISCHARGE:                                HISTORY & PHYSICAL   CHIEF COMPLAINT:  Weakness.   HISTORY OF PRESENT ILLNESS:  Ms. Natalie Reyes is a 60 year old female with a known  history of depression, liposarcoma, and recent bilateral breast  augmentation.  Patient presents to the ED and was discovered to have  critical anemia.  Patient had surgery two days ago.  Since the surgery, the  patient states that she has been feeling weak with no energy, woozy, dizzy,  and almost felt as if she was going to fall out.  The patient states that  she saw her surgeon, who evaluated her and stated her wounds looked well.  It was his opinion that the patient was dehydrated.  Patient was given IV  fluids.  According to the patient, the patient stated that she received six  liters of fluid and was later discharged.  Patient presents to the ED not  feeling better.  In the ED, the patient was evaluated and found to have a  critical anemia of 4 and an INR of 3.5.  The patient denies any chest pain  or shortness of breath, just feels weak, dizzy, lightheaded, as if she is  going to fall out.  Does admit to some nausea but no vomiting.  No change in  stool.  No change in urine.  Denies any bleeding.  Has been taking  medications as directed.  The case has been discussed with the surgeon, who  stated that the estimated blood loss was only 50 cc.  Admission is deemed  necessary for further evaluation and treatment.   PAST MEDICAL HISTORY:  Significant for liposarcoma diagnosed in 1998, status  post excision followed by XRT x6 months.  Patient with a history of  depression.  Patient is currently on Coumadin therapy secondary to  an  occluded femoral artery graft in 1988.   MEDICATIONS ON ADMISSION:  1.  Coumadin 7 mg q.d.  INR on Monday was 1.8.  INR today in the ED 3.5.  2.  Lexapro 10 mg q.d.  3.  B12 tablets.   PAST SURGICAL HISTORY:  1.  Significant for bilateral breast augmentation two days ago.  Please note      that the patient resumed Coumadin immediately post surgery and has been      covered with Lovenox as well.  2.  Hysterectomy in 1994 secondary to dysfunctional uterine bleeding.   Patient denies appendectomy or cholecystectomy.   SOCIAL HISTORY:  Positive for alcohol.  Last drink was one day prior to  surgery.  Negative for drugs.  Negative for tobacco.   FAMILY HISTORY:  Noncontributory.   ALLERGIES:  No known drug allergies.   PHYSICAL EXAMINATION:  VITALS:  Temp 99.2, BP 104/54 on admission.  Post IV  fluids 132/56.  Pulse 96.  Sat 96%.  HEENT:  Significant for pale sclerae.  NECK:  No nodes.  No JVD.  LUNGS:  No rales or rhonchi.  CHEST:  There is ecchymosis at the upper chest at the lower level of the  patient's bandages.  Patient refused to have the bandages removed for  further evaluation.  HEART:  Regular S1.  No S3 appreciated.  ABDOMEN:  Soft and nontender.  No hepatosplenomegaly.  EXTREMITIES:  Pale nailbeds.  No clubbing or cyanosis.  RECTAL:  Performed by ED doctor, reportedly heme-negative.  NEURO:  Nonfocal.   DATA:  CBC:  White count 13.3, hemoglobin 13.6, platelets 206, neutrophil  count 78%.  BMET within normal limits.   CHEST X-RAY:  A moderate amount of subcutaneous emphysema bilaterally in the  chest wall, which is most likely related to the recent surgery.  Soft tissue  infection is another consideration; however, it would appear to be too early  for this amount of gas in the tissues related solely to infection.  Also,  small bilateral effusions without infiltrate.   INR 3.5, amylase 64, lipase 22.  AST and ALT 70 and 39, respectively.  Bilirubin 0.7.    ASSESSMENT:  1.  Postoperative critical anemia.  2.  Status post bilateral breast augmentation.  Per surgeon, estimated blood      loss 50 cc.  3.  History of liposarcoma in 1998, status post excision and XRT x6 months.      Status post femoral artery graft related to resection and surgery.      Also, status post occluded femoral artery graft, treated with urokinase.  4.  Depression.  5.  Leukocytosis of 13,000.   Recommend patient be admitted to a stepdown unit for evaluation and  treatment of critical anemia.  The most likely cause for this is secondary  to postop bleeding.  Despite estimated blood loss of only 50 cc, the patient  is on a therapeutic level of Coumadin and may have more than what is obvious  to the eye of oozing of blood into the interstitium.  Also, contributing  factor is the dilutional effect, as the patient received 6 liters of fluids  prior to presentation to the ED.  The patient will be transfused, coags have  been checked, which have been done.  Will hold the patient's Coumadin at  this time until it is established there is no ongoing blood loss.  Will  obtain a CT of the chest to rule out deep hematoma.  As the patient has mild  leukocytosis and a low grade temp, it was thought empiric antibiotics to  rule out postop infection.  Will make further recommendations after review  of the above studies.     Joen Laura   RDP/MEDQ  D:  12/20/2003  T:  12/20/2003  Job:  [id number]   cc:   Darius Bump, M.D.  [place] [address]  [place] [address]  Fax: [phone]

## 2010-09-02 ENCOUNTER — Encounter (HOSPITAL_BASED_OUTPATIENT_CLINIC_OR_DEPARTMENT_OTHER): Payer: PRIVATE HEALTH INSURANCE | Admitting: Oncology

## 2010-09-02 ENCOUNTER — Other Ambulatory Visit: Payer: Self-pay | Admitting: Oncology

## 2010-09-02 DIAGNOSIS — Z8589 Personal history of malignant neoplasm of other organs and systems: Secondary | ICD-10-CM

## 2010-09-02 DIAGNOSIS — Z86718 Personal history of other venous thrombosis and embolism: Secondary | ICD-10-CM

## 2010-09-02 LAB — COMPREHENSIVE METABOLIC PANEL
ALT: 14 U/L (ref 0–35)
AST: 22 U/L (ref 0–37)
Albumin: 4.6 g/dL (ref 3.5–5.2)
Alkaline Phosphatase: 58 U/L (ref 39–117)
BUN: 13 mg/dL (ref 6–23)
CO2: 30 mEq/L (ref 19–32)
Calcium: 10.2 mg/dL (ref 8.4–10.5)
Chloride: 100 mEq/L (ref 96–112)
Creatinine, Ser: 0.6 mg/dL (ref 0.50–1.10)
Glucose, Bld: 103 mg/dL — ABNORMAL HIGH (ref 70–99)
Potassium: 4.2 mEq/L (ref 3.5–5.3)
Sodium: 139 mEq/L (ref 135–145)
Total Bilirubin: 0.5 mg/dL (ref 0.3–1.2)
Total Protein: 7.7 g/dL (ref 6.0–8.3)

## 2010-09-02 LAB — PROTIME-INR
INR: 1.8 — ABNORMAL LOW (ref 2.00–3.50)
Protime: 21.6 Seconds — ABNORMAL HIGH (ref 10.6–13.4)

## 2010-09-02 LAB — LACTATE DEHYDROGENASE: LDH: 173 U/L (ref 94–250)

## 2010-09-09 ENCOUNTER — Encounter (HOSPITAL_BASED_OUTPATIENT_CLINIC_OR_DEPARTMENT_OTHER): Payer: PRIVATE HEALTH INSURANCE | Admitting: Oncology

## 2010-09-09 DIAGNOSIS — Z86718 Personal history of other venous thrombosis and embolism: Secondary | ICD-10-CM

## 2010-09-09 DIAGNOSIS — Z7901 Long term (current) use of anticoagulants: Secondary | ICD-10-CM

## 2010-09-09 DIAGNOSIS — Z8589 Personal history of malignant neoplasm of other organs and systems: Secondary | ICD-10-CM

## 2010-10-06 ENCOUNTER — Encounter (HOSPITAL_BASED_OUTPATIENT_CLINIC_OR_DEPARTMENT_OTHER): Payer: PRIVATE HEALTH INSURANCE | Admitting: Oncology

## 2010-10-06 ENCOUNTER — Other Ambulatory Visit: Payer: Self-pay | Admitting: Oncology

## 2010-10-06 DIAGNOSIS — Z8589 Personal history of malignant neoplasm of other organs and systems: Secondary | ICD-10-CM

## 2010-10-06 DIAGNOSIS — Z7901 Long term (current) use of anticoagulants: Secondary | ICD-10-CM

## 2010-10-06 DIAGNOSIS — Z86718 Personal history of other venous thrombosis and embolism: Secondary | ICD-10-CM

## 2010-10-06 LAB — PROTIME-INR
INR: 1.3 — ABNORMAL LOW (ref 2.00–3.50)
Protime: 15.6 Seconds — ABNORMAL HIGH (ref 10.6–13.4)

## 2010-10-10 ENCOUNTER — Other Ambulatory Visit: Payer: Self-pay | Admitting: Oncology

## 2010-10-10 ENCOUNTER — Encounter: Payer: PRIVATE HEALTH INSURANCE | Admitting: Oncology

## 2010-10-10 DIAGNOSIS — IMO0002 Reserved for concepts with insufficient information to code with codable children: Secondary | ICD-10-CM

## 2010-10-16 ENCOUNTER — Ambulatory Visit (HOSPITAL_COMMUNITY)
Admission: RE | Admit: 2010-10-16 | Discharge: 2010-10-16 | Disposition: A | Discharge status: Home / Self Care | Payer: PRIVATE HEALTH INSURANCE | Source: Ambulatory Visit | Attending: Oncology | Admitting: Oncology

## 2010-10-16 DIAGNOSIS — M899 Disorder of bone, unspecified: Secondary | ICD-10-CM | POA: Insufficient documentation

## 2010-10-16 DIAGNOSIS — IMO0002 Reserved for concepts with insufficient information to code with codable children: Secondary | ICD-10-CM

## 2010-10-21 ENCOUNTER — Encounter (INDEPENDENT_AMBULATORY_CARE_PROVIDER_SITE_OTHER): Payer: Self-pay | Admitting: General Surgery

## 2010-10-27 ENCOUNTER — Encounter (INDEPENDENT_AMBULATORY_CARE_PROVIDER_SITE_OTHER): Payer: Self-pay | Admitting: General Surgery

## 2010-10-27 ENCOUNTER — Ambulatory Visit (INDEPENDENT_AMBULATORY_CARE_PROVIDER_SITE_OTHER): Payer: PRIVATE HEALTH INSURANCE | Admitting: General Surgery

## 2010-10-27 VITALS — BP 142/92 | HR 60 | Temp 97.4°F | Ht 67.0 in | Wt 115.6 lb

## 2010-10-27 DIAGNOSIS — R222 Localized swelling, mass and lump, trunk: Secondary | ICD-10-CM

## 2010-10-27 NOTE — Progress Notes (Signed)
Subjective:     Patient ID: Natalie Reyes, female   DOB: [birth year], 60 y.o.   MRN: [id number]  HPI Patient is referred back to me by Dr. Cephas Darby for evaluation of a soft tissue mass of the left presacral area.  The patient has a significant past history of a liposarcoma sarcoma of the right thigh in February of 1989. She underwent extensive resection, arterial vascular graft, plastic surgery, chemotherapy, radiation therapy. She had problems with thrombosis of the arterial graft requiring embolectomy and thrombolytic therapy. She has been on Coumadin over the past 13 years. Her health has  otherwise been excellent. She underwent bilateral breast implant procedures in 2005. There has been no evidence of recurrence of her liposarcoma. Dr. Cyndie Chime is her primary care physician.  She has noticed a lump in the left presacral area. It has not been painful or inflamed.  ultrasound of this area was done and really did not show the mass, although they commented on a  bony prominence of the pelvic bones and wondered if this was a bony prominence.  Past Medical History  Diagnosis Date  . Myxoid liposarcoma   . Arterial thrombosis   . Heart murmur     mitral valve prolapse    Current Outpatient Prescriptions  Medication Sig Dispense Refill  . warfarin (COUMADIN) 1 MG tablet Take 1 mg by mouth as directed. Verify dosage with pt.        No Known Allergies  History reviewed. No pertinent family history.  History  Substance Use Topics  . Smoking status: Never Smoker   . Smokeless tobacco: Not on file  . Alcohol Use: 4.2 oz/week    7 Glasses of wine per week    Review of Systems  Constitutional: Negative.   HENT: Negative.   Eyes: Negative.   Respiratory: Negative.   Cardiovascular: Negative.   Gastrointestinal: Negative.   Genitourinary: Negative.   Musculoskeletal: Negative.   Neurological: Negative.   Hematological: Negative.   Psychiatric/Behavioral: Negative.        Objective:   Physical Exam  Constitutional: She is oriented to person, place, and time. She appears well-developed and well-nourished. No distress.  HENT:  Head: Normocephalic and atraumatic.  Mouth/Throat: No oropharyngeal exudate.  Eyes: Conjunctivae and EOM are normal. Pupils are equal, round, and reactive to light. No scleral icterus.  Neck: Normal range of motion. Neck supple. No JVD present. No tracheal deviation present. No thyromegaly present.  Cardiovascular: Normal rate, regular rhythm and normal heart sounds.  Exam reveals no friction rub.   No murmur heard. Pulmonary/Chest: Effort normal and breath sounds normal. No respiratory distress. She has no wheezes. She has no rales. She exhibits no tenderness.  Abdominal: Bowel sounds are normal. She exhibits no distension and no mass. There is no tenderness. There is no rebound and no guarding.  Musculoskeletal: Normal range of motion. She exhibits no edema and no tenderness.       Scars right thigh healed  Lymphadenopathy:    She has no cervical adenopathy.  Neurological: She is alert and oriented to person, place, and time.  Skin: Skin is warm and dry. No rash noted. No erythema.     Psychiatric: She has a normal mood and affect. Her behavior is normal. Judgment and thought content normal.       Assessment:     2.5 cm soft tissue mass left presacral area. I suspect this is a benign process such as a firm lipoma or cystic process. It  is less likely to be muscle or bone.  Past history liposarcoma right thigh, no evidence of recurrent 13 years postop resection.    chronic Coumadin therapy.    Plan:     Believe this area should be excised for histologic confirmation, and the patient agrees.  We will need to stop her Coumadin for 5 days preop, and we will ask for Dr. Patsy Lager permission for that.  I discussed indications and details of surgery with her. Risks and complications have been outlined, including but not  limited to bleeding, infection, nerve damage with chronic pain or numbness, further surgery if this turns out to be malignant, cardiac or thrombotic problems. She understands  these issues well. At this time all questions were answered. She's in full agreement  with this plan.

## 2010-10-27 NOTE — Patient Instructions (Signed)
The small mass under the skin in your left hip is in the pre-sacral area. It is mobile and feels like a firm lipoma or cystic process. It seems too mobile to be a bony prominence of your pelvic bone. Because of your history of sarcoma of the right thigh, this area should be excised to clarify the diagnosis. I suspect that it is benign however. We will schedule the surgery at your convenience in the near future.

## 2010-10-28 ENCOUNTER — Encounter (HOSPITAL_BASED_OUTPATIENT_CLINIC_OR_DEPARTMENT_OTHER): Payer: PRIVATE HEALTH INSURANCE | Admitting: Oncology

## 2010-10-28 ENCOUNTER — Other Ambulatory Visit: Payer: Self-pay | Admitting: Oncology

## 2010-10-28 DIAGNOSIS — Z7901 Long term (current) use of anticoagulants: Secondary | ICD-10-CM

## 2010-10-28 DIAGNOSIS — Z86718 Personal history of other venous thrombosis and embolism: Secondary | ICD-10-CM

## 2010-10-28 DIAGNOSIS — Z8589 Personal history of malignant neoplasm of other organs and systems: Secondary | ICD-10-CM

## 2010-10-28 LAB — PROTIME-INR
INR: 2.3 (ref 2.00–3.50)
Protime: 27.6 Seconds — ABNORMAL HIGH (ref 10.6–13.4)

## 2010-10-29 ENCOUNTER — Telehealth (INDEPENDENT_AMBULATORY_CARE_PROVIDER_SITE_OTHER): Payer: Self-pay

## 2010-10-29 NOTE — Telephone Encounter (Signed)
Medical clearance here from Dr Cyndie Chime with ok to d/c coumadin 3days pre op. Attached to chart and to surgery schedulers.

## 2010-11-14 ENCOUNTER — Other Ambulatory Visit (INDEPENDENT_AMBULATORY_CARE_PROVIDER_SITE_OTHER): Payer: Self-pay | Admitting: General Surgery

## 2010-11-14 ENCOUNTER — Ambulatory Visit (HOSPITAL_BASED_OUTPATIENT_CLINIC_OR_DEPARTMENT_OTHER)
Admission: RE | Admit: 2010-11-14 | Discharge: 2010-11-14 | Disposition: A | Discharge status: Home / Self Care | Payer: PRIVATE HEALTH INSURANCE | Source: Ambulatory Visit | Attending: General Surgery | Admitting: General Surgery

## 2010-11-14 DIAGNOSIS — Z8589 Personal history of malignant neoplasm of other organs and systems: Secondary | ICD-10-CM | POA: Insufficient documentation

## 2010-11-14 DIAGNOSIS — Z7901 Long term (current) use of anticoagulants: Secondary | ICD-10-CM | POA: Insufficient documentation

## 2010-11-14 DIAGNOSIS — D1739 Benign lipomatous neoplasm of skin and subcutaneous tissue of other sites: Secondary | ICD-10-CM

## 2010-11-14 DIAGNOSIS — M799 Soft tissue disorder, unspecified: Secondary | ICD-10-CM | POA: Insufficient documentation

## 2010-11-14 LAB — APTT: aPTT: 32 seconds (ref 24–37)

## 2010-11-14 LAB — PROTIME-INR
INR: 1.13 (ref 0.00–1.49)
Prothrombin Time: 14.7 seconds (ref 11.6–15.2)

## 2010-11-14 LAB — POCT HEMOGLOBIN-HEMACUE: Hemoglobin: 13.3 g/dL (ref 12.0–15.0)

## 2010-11-17 ENCOUNTER — Telehealth (INDEPENDENT_AMBULATORY_CARE_PROVIDER_SITE_OTHER): Payer: Self-pay

## 2010-11-17 NOTE — Telephone Encounter (Signed)
Pt advised of path result and po appt made.

## 2010-11-17 NOTE — Op Note (Signed)
[name], Natalie Reyes              ACCOUNT NO.:  [id number]  MEDICAL RECORD NO.:  [id number]  LOCATION:                                 FACILITY:  PHYSICIAN:  Angelia Mould. Derrell Lolling, M.D.DATE OF BIRTH:  07/28/1950  DATE OF PROCEDURE:  11/14/2010 DATE OF DISCHARGE:                              OPERATIVE REPORT   PREOPERATIVE DIAGNOSIS:  A 3-cm soft tissue mass of trunk, left presacral area.  POSTOPERATIVE DIAGNOSIS:  A 3-cm soft tissue mass of trunk, left presacral area.  OPERATION PERFORMED:  Excision of 3-cm soft tissue mass, left presacral area.  SURGEON:  Angelia Mould. 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  She underwent extensive resection, arterial vascular graft, plastic surgery, chemotherapy, and radiation therapy.  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 Her health has otherwise been excellent.  There is no evidence of recurrence of the sarcoma.  She is followed by Dr. Cyndie Chime.  Current problem is that she noticed a lump in the left presacral area, which has not been painful or inflamed.  Ultrasound showed fatty tissue only.  On exam, there is a 3-cm mobile lobulated palpable mass in the left presacral area, which does not appear to be attached to the bone.  She was brought to operating room electively for excision of this area.  OPERATIVE TECHNIQUE:  Following the induction of general endotracheal anesthesia, the patient was placed in the prone position.  The presacral and gluteal area were prepped and draped in a sterile fashion.  Surgical time-out was held identifying correct patient, correct procedure, and correct site.  Intravenous antibiotics were given.  I could palpate the mass, and we used a marking pen to mark it.  Xylocaine  1% with epinephrine was used as local infiltration anesthetic.  Transverse incision was made probably about 3.5 cm in length.  Dissection was carried down through subcutaneous tissue.  We entered what appeared to be the capsule of a multilobulated lipoma.  This did not feel like a malignancy.  We dissected around the capsule of this, which was very thin.  In some areas medially, it went all the way down to the periosteum of the bone, but did not invade bone and did not act like a malignancy.  We dissected around this circumferentially and took this out, and it was at least 3 cm in size.  This was sent for routine histology with the appropriate history attached.  The wound was irrigated with saline.  Hemostasis was excellent and achieved with electrocautery.  I was able to close the subcutaneous tissue with multiple interrupted sutures of 3-0 Vicryl.  The skin was closed with a running subcuticular suture of 4-0 Monocryl and Dermabond.  The patient tolerated the procedure well and was taken to the recovery room in stable condition.  Estimated blood loss was about 10 mL.  Complications none.  Sponge, needle, and instrument counts were correct.     Angelia Mould. Derrell Lolling, M.D.  HMI/MEDQ  D:  11/14/2010  T:  11/14/2010  Job:  [id number]  cc:   Levert Feinstein, M.D., F.A.C.P.  Electronically Signed by Claud Kelp M.D. on 11/17/2010 07:01:25 PM

## 2010-12-01 ENCOUNTER — Encounter (INDEPENDENT_AMBULATORY_CARE_PROVIDER_SITE_OTHER): Payer: PRIVATE HEALTH INSURANCE | Admitting: General Surgery

## 2010-12-04 ENCOUNTER — Ambulatory Visit (INDEPENDENT_AMBULATORY_CARE_PROVIDER_SITE_OTHER): Payer: PRIVATE HEALTH INSURANCE | Admitting: General Surgery

## 2010-12-04 ENCOUNTER — Encounter (INDEPENDENT_AMBULATORY_CARE_PROVIDER_SITE_OTHER): Payer: Self-pay | Admitting: General Surgery

## 2010-12-04 VITALS — BP 140/78 | HR 72

## 2010-12-04 DIAGNOSIS — Z9889 Other specified postprocedural states: Secondary | ICD-10-CM

## 2010-12-04 NOTE — Progress Notes (Signed)
Subjective:     Patient ID: Natalie Reyes, female   DOB: Apr 21, 1950, 60 y.o.   MRN: [id number]  HPI This patient has a history of an aggressive sarcoma of the thigh many years ago. There is no known recurrence to date.  She recently underwent excision of a palpable mass in the left presacral area. The final pathology report shows a benign lipoma. She has no complaints about wound healing and seems to be doing well.  Review of Systems     Objective:   Physical Exam  Constitutional: She appears well-developed and well-nourished.  Skin: Skin is warm and dry. No rash noted. No erythema. No pallor.     Psychiatric: She has a normal mood and affect.       Assessment:     Benign lipoma left presacral area, recovering uneventfully following excision.    Plan:     Wound care discussed.  Return to see me p.r.n.

## 2010-12-04 NOTE — Patient Instructions (Signed)
The pathology report shows a benign lipoma. You have been given a copy of the pathology report. Your incision is healing nicely. Avoid contact sports for 2 more weeks, and then no restrictions thereafter. Return to see me if new problems arise.

## 2010-12-23 ENCOUNTER — Other Ambulatory Visit: Payer: Self-pay | Admitting: Oncology

## 2010-12-23 ENCOUNTER — Encounter (HOSPITAL_BASED_OUTPATIENT_CLINIC_OR_DEPARTMENT_OTHER): Payer: PRIVATE HEALTH INSURANCE | Admitting: Oncology

## 2010-12-23 DIAGNOSIS — Z86718 Personal history of other venous thrombosis and embolism: Secondary | ICD-10-CM

## 2010-12-23 DIAGNOSIS — Z7901 Long term (current) use of anticoagulants: Secondary | ICD-10-CM

## 2010-12-23 LAB — PROTIME-INR
INR: 2.8 (ref 2.00–3.50)
Protime: 33.6 Seconds — ABNORMAL HIGH (ref 10.6–13.4)

## 2011-01-27 DIAGNOSIS — T82868A Thrombosis of vascular prosthetic devices, implants and grafts, initial encounter: Secondary | ICD-10-CM | POA: Insufficient documentation

## 2011-01-27 DIAGNOSIS — Z7901 Long term (current) use of anticoagulants: Secondary | ICD-10-CM | POA: Insufficient documentation

## 2011-02-07 ENCOUNTER — Telehealth: Payer: Self-pay | Admitting: Oncology

## 2011-02-07 NOTE — Telephone Encounter (Signed)
Mailed the pt her June 2013 appt calendar. °

## 2011-02-11 ENCOUNTER — Other Ambulatory Visit: Payer: Self-pay | Admitting: Pharmacist

## 2011-02-11 DIAGNOSIS — T82898A Other specified complication of vascular prosthetic devices, implants and grafts, initial encounter: Secondary | ICD-10-CM

## 2011-02-11 DIAGNOSIS — Z7901 Long term (current) use of anticoagulants: Secondary | ICD-10-CM

## 2011-02-17 ENCOUNTER — Encounter: Payer: Self-pay | Admitting: *Deleted

## 2011-02-17 DIAGNOSIS — I749 Embolism and thrombosis of unspecified artery: Secondary | ICD-10-CM | POA: Insufficient documentation

## 2011-02-17 DIAGNOSIS — R011 Cardiac murmur, unspecified: Secondary | ICD-10-CM | POA: Insufficient documentation

## 2011-02-17 DIAGNOSIS — C499 Malignant neoplasm of connective and soft tissue, unspecified: Secondary | ICD-10-CM | POA: Insufficient documentation

## 2011-02-17 DIAGNOSIS — B009 Herpesviral infection, unspecified: Secondary | ICD-10-CM | POA: Insufficient documentation

## 2011-02-17 DIAGNOSIS — M810 Age-related osteoporosis without current pathological fracture: Secondary | ICD-10-CM | POA: Insufficient documentation

## 2011-02-20 ENCOUNTER — Encounter: Payer: PRIVATE HEALTH INSURANCE | Admitting: Gynecology

## 2011-02-24 ENCOUNTER — Ambulatory Visit (INDEPENDENT_AMBULATORY_CARE_PROVIDER_SITE_OTHER): Payer: PRIVATE HEALTH INSURANCE | Admitting: Gynecology

## 2011-02-24 ENCOUNTER — Encounter: Payer: Self-pay | Admitting: Gynecology

## 2011-02-24 ENCOUNTER — Other Ambulatory Visit (HOSPITAL_COMMUNITY)
Admission: RE | Admit: 2011-02-24 | Discharge: 2011-02-24 | Disposition: A | Discharge status: Home / Self Care | Payer: PRIVATE HEALTH INSURANCE | Source: Ambulatory Visit | Attending: Gynecology | Admitting: Gynecology

## 2011-02-24 VITALS — BP 130/70 | Ht 67.0 in | Wt 115.0 lb

## 2011-02-24 DIAGNOSIS — Z01419 Encounter for gynecological examination (general) (routine) without abnormal findings: Secondary | ICD-10-CM

## 2011-02-24 DIAGNOSIS — B373 Candidiasis of vulva and vagina: Secondary | ICD-10-CM

## 2011-02-24 DIAGNOSIS — N898 Other specified noninflammatory disorders of vagina: Secondary | ICD-10-CM

## 2011-02-24 DIAGNOSIS — Z13220 Encounter for screening for lipoid disorders: Secondary | ICD-10-CM

## 2011-02-24 DIAGNOSIS — B3731 Acute candidiasis of vulva and vagina: Secondary | ICD-10-CM

## 2011-02-24 DIAGNOSIS — N3090 Cystitis, unspecified without hematuria: Secondary | ICD-10-CM

## 2011-02-24 DIAGNOSIS — R823 Hemoglobinuria: Secondary | ICD-10-CM

## 2011-02-24 DIAGNOSIS — M810 Age-related osteoporosis without current pathological fracture: Secondary | ICD-10-CM

## 2011-02-24 DIAGNOSIS — Z131 Encounter for screening for diabetes mellitus: Secondary | ICD-10-CM

## 2011-02-24 MED ORDER — NITROFURANTOIN MONOHYD MACRO 100 MG PO CAPS: ORAL_CAPSULE | ORAL | Status: DC

## 2011-02-24 MED ORDER — FLUCONAZOLE 150 MG PO TABS: 150.0000 mg | ORAL_TABLET | Freq: Once | ORAL | Status: AC

## 2011-02-24 NOTE — Progress Notes (Signed)
Natalie Reyes 1950/10/18 [id number]        60 y.o.  for annual exam.  Overall doing well. She is having some issues with recurrent cystitis following intercourse.  Past medical history,surgical history, medications, allergies, family history and social history were all reviewed and documented in the EPIC chart. ROS:  Was performed and pertinent positives and negatives are included in the history.  Exam: chaperone present Filed Vitals:   02/24/11 1523  BP: 130/70   General appearance  Normal Skin grossly normal Head/Neck normal with no cervical or supraclavicular adenopathy thyroid normal Lungs  clear Cardiac RR, without RMG Abdominal  soft, nontender, without masses, organomegaly or hernia Groin right graft palpable without pulse Breasts  examined lying and sitting without masses, retractions, discharge or axillary adenopathy.  Bilateral implants noted Pelvic  Ext/BUS/vagina  normal with mild atrophic changes thick white discharge noted, Pap of cuff done  Adnexa  Without masses or tenderness    Anus and perineum  normal   Rectovaginal  normal sphincter tone without palpated masses or tenderness.    Assessment/Plan:  60 y.o. female for annual exam.    1. History recurrent cystitis. Will check UA today. I suggested she try Macrobid 100 mg postcoitally to see if this doesn't prevent her infections. She is very active up to 3 encounters a day and hopefully prophylactic antibiotic to prevent overt cystitis. #30 Macrobid 2 refills prescribed. 2. Osteoporosis. Patient had been on Blakeslee but stopped it. She had read some information was concerned about long-term risks to include cancer. I reviewed with her am unaware of any strong association of bisphosphonates with cancer and that with a -3.7 T score I think the treatment is strongly recommended. She is due for her DEXA now and she will go ahead schedule this and will follow up and further discussed. Increase calcium vitamin D was reviewed  and I ordered a vitamin D level today. 3. White discharge. KOH wet prep is positive for yeast we'll treat with Diflucan 150x1 dose. 4. Pap smear. I did a Pap of her cuff. She does have a history of sarcoma and radiation. 5. Breast health. SBE monthly reviewed.  Has bilateral implants. Due for mammogram December 2012 I reminded her to schedule this now. 6. Colonoscopy. She had a colonoscopy this past year. 7. Health maintenance. Baseline CBC glucose lipid profile urinalysis was ordered. She'll follow up for her DEXA otherwise will see me on an annual basis.    Dara Lords MD, 4:35 PM 02/24/2011

## 2011-02-24 NOTE — Patient Instructions (Signed)
Follow up for Bone density

## 2011-02-25 LAB — VITAMIN D 25 HYDROXY (VIT D DEFICIENCY, FRACTURES): Vit D, 25-Hydroxy: 33 ng/mL (ref 30–89)

## 2011-02-25 LAB — GLUCOSE, RANDOM: Glucose, Bld: 104 mg/dL — ABNORMAL HIGH (ref 70–99)

## 2011-02-26 ENCOUNTER — Ambulatory Visit: Payer: Self-pay | Admitting: Oncology

## 2011-02-26 ENCOUNTER — Other Ambulatory Visit: Payer: Self-pay | Admitting: Oncology

## 2011-02-26 ENCOUNTER — Other Ambulatory Visit (HOSPITAL_BASED_OUTPATIENT_CLINIC_OR_DEPARTMENT_OTHER): Payer: PRIVATE HEALTH INSURANCE | Admitting: Lab

## 2011-02-26 ENCOUNTER — Ambulatory Visit: Payer: PRIVATE HEALTH INSURANCE

## 2011-02-26 DIAGNOSIS — Z8589 Personal history of malignant neoplasm of other organs and systems: Secondary | ICD-10-CM

## 2011-02-26 DIAGNOSIS — T82868A Thrombosis of vascular prosthetic devices, implants and grafts, initial encounter: Secondary | ICD-10-CM

## 2011-02-26 DIAGNOSIS — Z7901 Long term (current) use of anticoagulants: Secondary | ICD-10-CM

## 2011-02-26 DIAGNOSIS — Z86718 Personal history of other venous thrombosis and embolism: Secondary | ICD-10-CM

## 2011-02-26 LAB — PROTIME-INR
INR: 1.9 — ABNORMAL LOW (ref 2.00–3.50)
Protime: 22.8 Seconds — ABNORMAL HIGH (ref 10.6–13.4)

## 2011-02-26 LAB — POCT INR: INR: 1.9

## 2011-02-26 NOTE — Progress Notes (Signed)
No complaints.  INR slightly subtherapeutic.  May be related to increased Vitamin K intake yesterday.  Will continue current dose, and recheck INR in 2 months.

## 2011-03-27 ENCOUNTER — Ambulatory Visit (INDEPENDENT_AMBULATORY_CARE_PROVIDER_SITE_OTHER): Payer: PRIVATE HEALTH INSURANCE | Admitting: Women's Health

## 2011-03-27 ENCOUNTER — Encounter: Payer: Self-pay | Admitting: Women's Health

## 2011-03-27 VITALS — Temp 100.3°F

## 2011-03-27 DIAGNOSIS — J329 Chronic sinusitis, unspecified: Secondary | ICD-10-CM

## 2011-03-27 MED ORDER — AZITHROMYCIN 250 MG PO TABS: ORAL_TABLET | ORAL | Status: AC

## 2011-03-27 NOTE — Progress Notes (Signed)
Patient ID: Natalie Reyes, female   DOB: April 24, 1950, 61 y.o.   MRN: [id number] Presents with a complaint of a two-day headache, molars ache with pain in frontal sinus cavities, throat is scratchy, and generally not feeling well. Denies any nausea or vomiting. States has a cough, nonproductive from postnasal drip. History of a sinus infection with same symptoms. Has not had the flu vaccine due to on Coumadin. Needs to be on an antibiotic shortest length of time possible.  Exam: Appears ill. Lungs clear throughout, throat slightly erythematous, tender over sinus cavities, mild cervical lymphadenopathy. Temp 100.3   Sinus infection  Plan: Z-Pak, prescription, proper use given and reviewed, instructed to increase rest and fluids. Reviewed if not better in 2 days to call or return.

## 2011-04-01 ENCOUNTER — Telehealth: Payer: Self-pay | Admitting: Pharmacist

## 2011-04-23 ENCOUNTER — Ambulatory Visit: Payer: PRIVATE HEALTH INSURANCE | Admitting: Pharmacist

## 2011-04-23 ENCOUNTER — Other Ambulatory Visit (HOSPITAL_BASED_OUTPATIENT_CLINIC_OR_DEPARTMENT_OTHER): Payer: PRIVATE HEALTH INSURANCE | Admitting: Lab

## 2011-04-23 DIAGNOSIS — Z7901 Long term (current) use of anticoagulants: Secondary | ICD-10-CM

## 2011-04-23 DIAGNOSIS — T82898A Other specified complication of vascular prosthetic devices, implants and grafts, initial encounter: Secondary | ICD-10-CM

## 2011-04-23 DIAGNOSIS — T82868A Thrombosis of vascular prosthetic devices, implants and grafts, initial encounter: Secondary | ICD-10-CM

## 2011-04-23 LAB — PROTIME-INR
INR: 2.2 (ref 2.00–3.50)
Protime: 26.4 Seconds — ABNORMAL HIGH (ref 10.6–13.4)

## 2011-04-23 LAB — POCT INR: INR: 2.2

## 2011-04-23 NOTE — Patient Instructions (Signed)
Continue current dose.  Recheck INR in 2 months. 06/16/11 @ 2:30

## 2011-05-04 ENCOUNTER — Other Ambulatory Visit: Payer: Self-pay | Admitting: Pharmacist

## 2011-05-04 DIAGNOSIS — T82868A Thrombosis of vascular prosthetic devices, implants and grafts, initial encounter: Secondary | ICD-10-CM

## 2011-05-29 ENCOUNTER — Other Ambulatory Visit: Payer: Self-pay | Admitting: Gynecology

## 2011-05-29 DIAGNOSIS — Z1231 Encounter for screening mammogram for malignant neoplasm of breast: Secondary | ICD-10-CM

## 2011-06-16 ENCOUNTER — Ambulatory Visit: Payer: PRIVATE HEALTH INSURANCE | Admitting: Pharmacist

## 2011-06-16 ENCOUNTER — Other Ambulatory Visit (HOSPITAL_BASED_OUTPATIENT_CLINIC_OR_DEPARTMENT_OTHER): Payer: PRIVATE HEALTH INSURANCE | Admitting: Lab

## 2011-06-16 DIAGNOSIS — T82868A Thrombosis of vascular prosthetic devices, implants and grafts, initial encounter: Secondary | ICD-10-CM

## 2011-06-16 DIAGNOSIS — Z7901 Long term (current) use of anticoagulants: Secondary | ICD-10-CM

## 2011-06-16 LAB — POCT INR: INR: 1.7

## 2011-06-16 LAB — PROTIME-INR
INR: 1.7 — ABNORMAL LOW (ref 2.00–3.50)
Protime: 20.4 Seconds — ABNORMAL HIGH (ref 10.6–13.4)

## 2011-06-16 NOTE — Patient Instructions (Signed)
Continue same dose of 7.5 mg daily.  Recheck your INR in 2 months

## 2011-06-30 ENCOUNTER — Ambulatory Visit
Admission: RE | Admit: 2011-06-30 | Discharge: 2011-06-30 | Disposition: A | Discharge status: Home / Self Care | Payer: PRIVATE HEALTH INSURANCE | Source: Ambulatory Visit | Attending: Gynecology | Admitting: Gynecology

## 2011-06-30 DIAGNOSIS — Z1231 Encounter for screening mammogram for malignant neoplasm of breast: Secondary | ICD-10-CM

## 2011-07-03 ENCOUNTER — Telehealth: Payer: Self-pay | Admitting: Oncology

## 2011-07-03 NOTE — Telephone Encounter (Signed)
pt lm to r/s appts,done,aware 6/25 and 7/1

## 2011-08-12 ENCOUNTER — Telehealth: Payer: Self-pay | Admitting: Oncology

## 2011-08-12 NOTE — Telephone Encounter (Signed)
Pt called and r/s appt from July to August 2013 lab before MD, inform MD's nurse of rescheduled appt.

## 2011-08-12 NOTE — Telephone Encounter (Signed)
Pt left VM, called back left message regarding 1st opening for Dr. Cyndie Chime

## 2011-08-19 ENCOUNTER — Other Ambulatory Visit: Payer: PRIVATE HEALTH INSURANCE | Admitting: Lab

## 2011-08-19 ENCOUNTER — Ambulatory Visit: Payer: PRIVATE HEALTH INSURANCE

## 2011-08-21 ENCOUNTER — Other Ambulatory Visit (HOSPITAL_BASED_OUTPATIENT_CLINIC_OR_DEPARTMENT_OTHER): Payer: PRIVATE HEALTH INSURANCE | Admitting: Lab

## 2011-08-21 ENCOUNTER — Ambulatory Visit: Payer: PRIVATE HEALTH INSURANCE | Admitting: Pharmacist

## 2011-08-21 DIAGNOSIS — T82868A Thrombosis of vascular prosthetic devices, implants and grafts, initial encounter: Secondary | ICD-10-CM

## 2011-08-21 DIAGNOSIS — Z7901 Long term (current) use of anticoagulants: Secondary | ICD-10-CM

## 2011-08-21 DIAGNOSIS — Z5181 Encounter for therapeutic drug level monitoring: Secondary | ICD-10-CM

## 2011-08-21 DIAGNOSIS — C50919 Malignant neoplasm of unspecified site of unspecified female breast: Secondary | ICD-10-CM

## 2011-08-21 LAB — PROTIME-INR
INR: 1.3 — ABNORMAL LOW (ref 2.00–3.50)
Protime: 15.6 Seconds — ABNORMAL HIGH (ref 10.6–13.4)

## 2011-08-21 LAB — POCT INR: INR: 1.3

## 2011-08-21 NOTE — Progress Notes (Signed)
Pts INR = 1.3 today.  She has had 2 consecutive INR readings that have been <2.   Missed 1 dose of Coumadin this past Sunday. Now taking Green Coffee Bean Extract 800 mg BID to help her lose weight.  There is a slight potential that this can inhibit the aggregation of platelets, although insignificant in study of CAD pts (contains Magnesium). She is also on Nitrofurantoin for bladder infection.  She has 12 days left of that RX.  Minimal to no effect on INR. Her dietary intake of vit K containing foods has increased.  She is eating kale/collards/broccoli. Dispite INR being low, pt is opposed to any change in her Coumadin dose.  She doesn't understand why she is on Coumadin anyway & wants to discuss this w/ Dr. Cyndie Chime at her next visit w/ him in August. Since pt was not willing to make a change in her Coumadin dose, I at least advised her to cut back on her dietary vit K intake. She would like to be seen next month & not before then.  She is leaving in the next week or so to visit her mother in [place] for 3 weeks. She has an ample supply of Coumadin tablets at home. Marily Lente, Pharm.D.

## 2011-08-25 ENCOUNTER — Other Ambulatory Visit: Payer: Self-pay | Admitting: *Deleted

## 2011-08-25 DIAGNOSIS — E7800 Pure hypercholesterolemia, unspecified: Secondary | ICD-10-CM

## 2011-09-01 ENCOUNTER — Other Ambulatory Visit: Payer: PRIVATE HEALTH INSURANCE | Admitting: Lab

## 2011-09-04 ENCOUNTER — Ambulatory Visit: Payer: PRIVATE HEALTH INSURANCE

## 2011-09-04 ENCOUNTER — Other Ambulatory Visit: Payer: PRIVATE HEALTH INSURANCE

## 2011-09-08 ENCOUNTER — Ambulatory Visit: Payer: PRIVATE HEALTH INSURANCE | Admitting: Oncology

## 2011-09-15 ENCOUNTER — Other Ambulatory Visit: Payer: PRIVATE HEALTH INSURANCE

## 2011-09-21 ENCOUNTER — Ambulatory Visit: Payer: PRIVATE HEALTH INSURANCE | Admitting: Oncology

## 2011-09-21 ENCOUNTER — Other Ambulatory Visit: Payer: PRIVATE HEALTH INSURANCE | Admitting: Lab

## 2011-09-21 ENCOUNTER — Ambulatory Visit: Payer: PRIVATE HEALTH INSURANCE

## 2011-09-22 ENCOUNTER — Other Ambulatory Visit: Payer: PRIVATE HEALTH INSURANCE

## 2011-09-22 ENCOUNTER — Ambulatory Visit: Payer: PRIVATE HEALTH INSURANCE

## 2011-09-22 ENCOUNTER — Other Ambulatory Visit: Payer: PRIVATE HEALTH INSURANCE | Admitting: Lab

## 2011-09-22 ENCOUNTER — Ambulatory Visit: Payer: PRIVATE HEALTH INSURANCE | Admitting: Pharmacist

## 2011-09-22 ENCOUNTER — Other Ambulatory Visit: Payer: Self-pay | Admitting: Pharmacist

## 2011-09-22 ENCOUNTER — Other Ambulatory Visit (HOSPITAL_BASED_OUTPATIENT_CLINIC_OR_DEPARTMENT_OTHER): Payer: PRIVATE HEALTH INSURANCE | Admitting: Lab

## 2011-09-22 DIAGNOSIS — T82868A Thrombosis of vascular prosthetic devices, implants and grafts, initial encounter: Secondary | ICD-10-CM

## 2011-09-22 DIAGNOSIS — Z5181 Encounter for therapeutic drug level monitoring: Secondary | ICD-10-CM

## 2011-09-22 DIAGNOSIS — Z7901 Long term (current) use of anticoagulants: Secondary | ICD-10-CM

## 2011-09-22 DIAGNOSIS — Z86718 Personal history of other venous thrombosis and embolism: Secondary | ICD-10-CM

## 2011-09-22 LAB — PROTIME-INR
INR: 2.4 (ref 2.00–3.50)
Protime: 28.8 Seconds — ABNORMAL HIGH (ref 10.6–13.4)

## 2011-09-22 LAB — POCT INR: INR: 2.4

## 2011-09-22 NOTE — Progress Notes (Signed)
INR = 2.4 on 7.5 mg/day No complaints.   She has not been taking the weight loss tablets (Green Coffee Bean Extract) that she was taking last month. INR therapeutic.  Continue same dose. Return in 1 month. Marily Lente, Pharm.D.

## 2011-10-28 ENCOUNTER — Other Ambulatory Visit (HOSPITAL_BASED_OUTPATIENT_CLINIC_OR_DEPARTMENT_OTHER): Payer: PRIVATE HEALTH INSURANCE | Admitting: Lab

## 2011-10-28 DIAGNOSIS — Z7901 Long term (current) use of anticoagulants: Secondary | ICD-10-CM

## 2011-10-28 DIAGNOSIS — Z86718 Personal history of other venous thrombosis and embolism: Secondary | ICD-10-CM

## 2011-10-28 DIAGNOSIS — Z8589 Personal history of malignant neoplasm of other organs and systems: Secondary | ICD-10-CM

## 2011-10-28 LAB — COMPREHENSIVE METABOLIC PANEL
ALT: 15 U/L (ref 0–35)
AST: 26 U/L (ref 0–37)
Albumin: 4.7 g/dL (ref 3.5–5.2)
Alkaline Phosphatase: 47 U/L (ref 39–117)
BUN: 18 mg/dL (ref 6–23)
CO2: 27 mEq/L (ref 19–32)
Calcium: 9.4 mg/dL (ref 8.4–10.5)
Chloride: 106 mEq/L (ref 96–112)
Creatinine, Ser: 0.83 mg/dL (ref 0.50–1.10)
Glucose, Bld: 95 mg/dL (ref 70–99)
Potassium: 4.6 mEq/L (ref 3.5–5.3)
Sodium: 140 mEq/L (ref 135–145)
Total Bilirubin: 0.9 mg/dL (ref 0.3–1.2)
Total Protein: 6.9 g/dL (ref 6.0–8.3)

## 2011-10-28 LAB — LACTATE DEHYDROGENASE: LDH: 169 U/L (ref 94–250)

## 2011-10-28 LAB — CBC WITH DIFFERENTIAL/PLATELET
BASO%: 1.1 % (ref 0.0–2.0)
Basophils Absolute: 0 10*3/uL (ref 0.0–0.1)
EOS%: 1.2 % (ref 0.0–7.0)
Eosinophils Absolute: 0.1 10*3/uL (ref 0.0–0.5)
HCT: 38 % (ref 34.8–46.6)
HGB: 12.6 g/dL (ref 11.6–15.9)
LYMPH%: 29.4 % (ref 14.0–49.7)
MCH: 29.9 pg (ref 25.1–34.0)
MCHC: 33.2 g/dL (ref 31.5–36.0)
MCV: 90.1 fL (ref 79.5–101.0)
MONO#: 0.4 10*3/uL (ref 0.1–0.9)
MONO%: 9.9 % (ref 0.0–14.0)
NEUT#: 2.6 10*3/uL (ref 1.5–6.5)
NEUT%: 58.4 % (ref 38.4–76.8)
Platelets: 182 10*3/uL (ref 145–400)
RBC: 4.22 10*6/uL (ref 3.70–5.45)
RDW: 13.5 % (ref 11.2–14.5)
WBC: 4.5 10*3/uL (ref 3.9–10.3)
lymph#: 1.3 10*3/uL (ref 0.9–3.3)

## 2011-10-29 ENCOUNTER — Telehealth: Payer: Self-pay | Admitting: *Deleted

## 2011-10-29 DIAGNOSIS — R635 Abnormal weight gain: Secondary | ICD-10-CM

## 2011-10-29 NOTE — Telephone Encounter (Signed)
Okay to add this to the blood draw

## 2011-10-29 NOTE — Telephone Encounter (Signed)
Pt is coming to have her lipid profile rechecked she would also like her thyroid checked if possible? Pt said that she has gained 10 pounds since April, she is still eating the same, exercising the same. Please advise

## 2011-10-29 NOTE — Telephone Encounter (Signed)
Pt informed with the below note, order placed in computer.

## 2011-10-30 ENCOUNTER — Ambulatory Visit (HOSPITAL_BASED_OUTPATIENT_CLINIC_OR_DEPARTMENT_OTHER): Payer: PRIVATE HEALTH INSURANCE | Admitting: Oncology

## 2011-10-30 ENCOUNTER — Telehealth: Payer: Self-pay | Admitting: Oncology

## 2011-10-30 VITALS — BP 135/76 | HR 67 | Temp 97.1°F | Resp 20 | Ht 67.0 in | Wt 122.0 lb

## 2011-10-30 DIAGNOSIS — T82868A Thrombosis of vascular prosthetic devices, implants and grafts, initial encounter: Secondary | ICD-10-CM

## 2011-10-30 DIAGNOSIS — Z859 Personal history of malignant neoplasm, unspecified: Secondary | ICD-10-CM

## 2011-10-30 DIAGNOSIS — Z7901 Long term (current) use of anticoagulants: Secondary | ICD-10-CM

## 2011-10-30 DIAGNOSIS — C499 Malignant neoplasm of connective and soft tissue, unspecified: Secondary | ICD-10-CM

## 2011-10-30 DIAGNOSIS — Z86718 Personal history of other venous thrombosis and embolism: Secondary | ICD-10-CM

## 2011-10-30 DIAGNOSIS — Z5181 Encounter for therapeutic drug level monitoring: Secondary | ICD-10-CM

## 2011-10-30 NOTE — Progress Notes (Signed)
Hematology and Oncology Follow Up Visit  Natalie Reyes [id number] 04-28-1950 61 y.o. 10/30/2011 2:52 PM   Principle Diagnosis: Encounter Diagnoses  Name Primary?  . Sarcoma of soft tissue   . Thrombosis of femoro-popliteal bypass graft   . 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  She required arterial vascular grafting.  She had problems for many years with recurrent graft occlusion requiring repeat surgeries and redo bypass procedures.  I put her on chronic therapeutic Coumadin and she has had no subsequent thrombotic events since the last embolectomy procedure done in September 1995.   Despite her impression that she does not require anticoagulation due to graft occlusion, I strongly recommended that she continue the anticoagulation.  She has a number of Dacron grafts.  These are going to act as a foreign body and likely be the nidus for stimulating new clot formation.  She reluctantly agrees to continue the Coumadin.  Overall she is doing well at this time with no new interim medical problems. She had a colonoscopy last year at my advice by Dr. Evette Cristal and this was reported to her as being perfectly normal. She continues to have routine followup mammograms and breast exams through her gynecologist,  Dr. Audie Box. I saw her last July when she noticed a subcutaneous mass overlying her left hip. I was able to palpate a spongy movable 4 x 4 centimeter subcutaneous mass approximately L5 level slightly above the iliac crest on the left. I referred her back to her general surgeon. This lesion was subsequently excised on 11/14/2010 and pathology showed a simple lipoma without any sarcomatoid features.  On exam: head and neck are normal lungs are clear regular cardiac rhythm no murmur no  lymphadenopathy abdomen soft nontender no mass no organomegaly. Vascular graft right proximal inguinal region no palpable pulse. No bruit. Symmetric 2+ pulses in the pudendal region bilaterally Absent dorsalis pedis pulse on the right foot which is a chronic finding. 1+ popliteal pulse on the right. Foot is warm and well perfused.    IMPRESSION:   1. Cured liposarcoma of right thigh. 2. 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  She continues to bag me to get her off the Coumadin. I asked her to schedule a followup appointment with Dr. Josephina Gip to get Doppler studies. I would be willing to stop her Coumadin given today's physical findings of a strong right pudendal pulse and palpable popliteal pulse.. 3. Status post bilateral breast implants complicated by significant blood loss due to tight anticoagulation around the procedure 11/2004. 4. History of mitral valve prolapse.   Medications: reviewed  Allergies: No Known Allergies   Physical Exam: Blood pressure 135/76, pulse 67, temperature 97.1 F (36.2 C), temperature source Oral, resp. rate 20, height 5\' 7"  (1.702 m), weight 122 lb (55.339 kg), last menstrual period 02/24/1991. Wt Readings from Last 3 Encounters:  10/30/11 122 lb (55.339 kg)  02/24/11 115 lb (52.164 kg)  10/27/10 115 lb 9.6 oz (52.436 kg)       Lab Results: Lab Results  Component Value Date   WBC 4.5 10/28/2011   HGB 12.6 10/28/2011   HCT 38.0 10/28/2011   MCV 90.1 10/28/2011   PLT 182 10/28/2011     Chemistry  Component Value Date/Time   NA 140 10/28/2011 1402   K 4.6 10/28/2011 1402   CL 106 10/28/2011 1402   CO2 27 10/28/2011 1402   BUN 18 10/28/2011 1402   CREATININE 0.83 10/28/2011 1402      Component Value Date/Time   CALCIUM 9.4 10/28/2011 1402   ALKPHOS 47 10/28/2011 1402   AST 26 10/28/2011 1402   ALT 15 10/28/2011 1402   BILITOT 0.9 10/28/2011 1402       CC:. Dr. Quita Skye. Hart Rochester; and  Dr. Colin Broach; Dr. Caprice Renshaw, MD 8/9/20132:52 PM

## 2011-10-30 NOTE — Telephone Encounter (Signed)
appts made and printed for pt aom °

## 2011-11-10 ENCOUNTER — Other Ambulatory Visit: Payer: PRIVATE HEALTH INSURANCE

## 2011-11-10 DIAGNOSIS — E7800 Pure hypercholesterolemia, unspecified: Secondary | ICD-10-CM

## 2011-11-10 DIAGNOSIS — R635 Abnormal weight gain: Secondary | ICD-10-CM

## 2011-11-10 LAB — LIPID PANEL
Cholesterol: 220 mg/dL — ABNORMAL HIGH (ref 0–200)
HDL: 60 mg/dL (ref >39)
LDL Cholesterol: 149 mg/dL — ABNORMAL HIGH (ref 0–99)
Total CHOL/HDL Ratio: 3.7 Ratio
Triglycerides: 54 mg/dL (ref <150)
VLDL: 11 mg/dL (ref 0–40)

## 2011-11-10 LAB — TSH: TSH: 3.566 u[IU]/mL (ref 0.350–4.500)

## 2011-11-17 ENCOUNTER — Ambulatory Visit (HOSPITAL_BASED_OUTPATIENT_CLINIC_OR_DEPARTMENT_OTHER): Payer: PRIVATE HEALTH INSURANCE | Admitting: Pharmacist

## 2011-11-17 ENCOUNTER — Other Ambulatory Visit (HOSPITAL_BASED_OUTPATIENT_CLINIC_OR_DEPARTMENT_OTHER): Payer: PRIVATE HEALTH INSURANCE | Admitting: Lab

## 2011-11-17 DIAGNOSIS — T82898A Other specified complication of vascular prosthetic devices, implants and grafts, initial encounter: Secondary | ICD-10-CM

## 2011-11-17 DIAGNOSIS — Z86718 Personal history of other venous thrombosis and embolism: Secondary | ICD-10-CM

## 2011-11-17 DIAGNOSIS — Z7901 Long term (current) use of anticoagulants: Secondary | ICD-10-CM

## 2011-11-17 DIAGNOSIS — T82868A Thrombosis of vascular prosthetic devices, implants and grafts, initial encounter: Secondary | ICD-10-CM

## 2011-11-17 LAB — PROTIME-INR
INR: 1.4 — ABNORMAL LOW (ref 2.00–3.50)
Protime: 16.8 Seconds — ABNORMAL HIGH (ref 10.6–13.4)

## 2011-11-17 NOTE — Progress Notes (Signed)
INR subtherapeutic today (1.4) on 7.5mg  daily. Pt has not missed any doses, but is eating tons of dried kale that she buys from the health food store as a snack.  She also has had salad for dinner the last 2 nights.  Pt is otherwise doing well.  She saw Dr. Cyndie Chime last week, and discussed discontinuation of her Coumadin since she has gone over 15 years after her graft thrombosis without recurrent events.  He has agreed to consider discontinuation of the Coumadin depending on Doppler findings through her vascular surgeon.  She plans to try and book an appt with her vascular surgeon soon, but this will be complicated by the fact that her insurance is moving to another state, and she needs to find new coverage by 12/06/11.   Pt is adamantly against increasing her dose or more frequent monitoring.  Pt will continue current dose of 7.5mg  daily, and we will recheck her INR in 1 month.   Provided Coumadin 7.5mg  tablet samples: Lot [id number], Exp 04/2012, #100.

## 2011-12-14 ENCOUNTER — Ambulatory Visit
Admission: RE | Admit: 2011-12-14 | Discharge: 2011-12-14 | Disposition: A | Discharge status: Home / Self Care | Payer: PRIVATE HEALTH INSURANCE | Source: Ambulatory Visit | Attending: Orthopedic Surgery | Admitting: Orthopedic Surgery

## 2011-12-14 ENCOUNTER — Other Ambulatory Visit: Payer: Self-pay | Admitting: Orthopedic Surgery

## 2011-12-14 DIAGNOSIS — S92009A Unspecified fracture of unspecified calcaneus, initial encounter for closed fracture: Secondary | ICD-10-CM

## 2011-12-15 ENCOUNTER — Ambulatory Visit: Payer: PRIVATE HEALTH INSURANCE

## 2011-12-15 ENCOUNTER — Other Ambulatory Visit: Payer: PRIVATE HEALTH INSURANCE | Admitting: Lab

## 2011-12-18 ENCOUNTER — Telehealth: Payer: Self-pay | Admitting: *Deleted
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
## 2011-12-25 ENCOUNTER — Telehealth: Payer: Self-pay | Admitting: Pharmacist

## 2011-12-25 NOTE — Telephone Encounter (Signed)
Message noted from Dr. Patsy Lager nurse on 9/27. Attempted to reach patient to schedule/reschedule lab and coumadin clinic appointment. Left message to have patient call and reschedule appointments. First message left on 12/21/11 with patient family member by another pharmacist. Please call [phone] to reschedule.

## 2012-01-14 ENCOUNTER — Telehealth: Payer: Self-pay | Admitting: Pharmacist

## 2012-01-18 ENCOUNTER — Telehealth: Payer: Self-pay | Admitting: Pharmacist

## 2012-01-18 NOTE — Telephone Encounter (Signed)
Pt called to let us know that she is currently not taking coumadin (a personal decision). She is not willing to do Lovenox.  She has an injured foot (muliple fractures) that she is recovering from. Lab and Coumadin clinic appointment scheduled for 02/09/12 @ 2pm. Pt feels she may be taking Coumadin again by that time. She will call us if this lab/clinic appointment needs to be cancelled or rescheduled.

## 2012-02-05 ENCOUNTER — Telehealth: Payer: Self-pay | Admitting: Pharmacist

## 2012-02-05 ENCOUNTER — Encounter: Payer: Self-pay | Admitting: Pharmacist

## 2012-02-05 NOTE — Progress Notes (Signed)
Pt called to cancel her Coumadin clinic appt that is scheduled for 02/09/12.  She states she has not been on Coumadin for 3 weeks.  However, a note from [place], [place].D on 01/18/12 reads that pt was off Coumadin at that time as well. She broke her left foot 8 weeks ago.  She has not been bearing weight on this foot.  She sees Dr. Cleophas Dunker (ortho).  She has discussed w/ him that she is off Coumadin and she tells me today that he said it was good she is off Coumadin because if she is on Coumadin "the swelling will never go down."  She has swelling in this foot but she says it is better than it has been.  She has pain only when she flexes her L foot. When I asked her if she is concerned that there could be clot there, she says she doesn't have a clotting disorder. She did not have a chance to see Dr. Hart Rochester (vascular) to be cleared to go off Coumadin. Natalie Reyes is not sure she will ever go back on Coumadin but she was willing to rethink that decision & she went ahead & scheduled a lab & Coumadin clinic appt for 03/04/12 (1 month from today).  She may cancel that appt if she remains off Coumadin. Marily Lente, Pharm.D.

## 2012-02-09 ENCOUNTER — Ambulatory Visit: Payer: PRIVATE HEALTH INSURANCE

## 2012-02-09 ENCOUNTER — Other Ambulatory Visit: Payer: PRIVATE HEALTH INSURANCE | Admitting: Lab

## 2012-02-19 ENCOUNTER — Other Ambulatory Visit: Payer: Self-pay | Admitting: Women's Health

## 2012-02-22 NOTE — Telephone Encounter (Signed)
rx called in KW 

## 2012-03-02 ENCOUNTER — Ambulatory Visit (INDEPENDENT_AMBULATORY_CARE_PROVIDER_SITE_OTHER): Payer: PRIVATE HEALTH INSURANCE | Admitting: Gynecology

## 2012-03-02 ENCOUNTER — Encounter: Payer: Self-pay | Admitting: Gynecology

## 2012-03-02 VITALS — Ht 67.5 in | Wt 113.0 lb

## 2012-03-02 DIAGNOSIS — Z13220 Encounter for screening for lipoid disorders: Secondary | ICD-10-CM

## 2012-03-02 DIAGNOSIS — M810 Age-related osteoporosis without current pathological fracture: Secondary | ICD-10-CM

## 2012-03-02 DIAGNOSIS — N952 Postmenopausal atrophic vaginitis: Secondary | ICD-10-CM

## 2012-03-02 DIAGNOSIS — Z01419 Encounter for gynecological examination (general) (routine) without abnormal findings: Secondary | ICD-10-CM

## 2012-03-02 LAB — CBC WITH DIFFERENTIAL/PLATELET
Basophils Absolute: 0 10*3/uL (ref 0.0–0.1)
Basophils Relative: 1 % (ref 0–1)
Eosinophils Absolute: 0.1 10*3/uL (ref 0.0–0.7)
Eosinophils Relative: 2 % (ref 0–5)
HCT: 42.1 % (ref 36.0–46.0)
Hemoglobin: 13.8 g/dL (ref 12.0–15.0)
Lymphocytes Relative: 30 % (ref 12–46)
Lymphs Abs: 1.8 10*3/uL (ref 0.7–4.0)
MCH: 29 pg (ref 26.0–34.0)
MCHC: 32.8 g/dL (ref 30.0–36.0)
MCV: 88.4 fL (ref 78.0–100.0)
Monocytes Absolute: 0.6 10*3/uL (ref 0.1–1.0)
Monocytes Relative: 10 % (ref 3–12)
Neutro Abs: 3.4 10*3/uL (ref 1.7–7.7)
Neutrophils Relative %: 57 % (ref 43–77)
Platelets: 243 10*3/uL (ref 150–400)
RBC: 4.76 MIL/uL (ref 3.87–5.11)
RDW: 13.3 % (ref 11.5–15.5)
WBC: 6 10*3/uL (ref 4.0–10.5)

## 2012-03-02 MED ORDER — ALENDRONATE SODIUM 70 MG PO TABS
70.0000 mg | ORAL_TABLET | ORAL | Status: DC
Start: 2012-03-02 — End: 2013-04-25

## 2012-03-02 MED ORDER — NITROFURANTOIN MONOHYD MACRO 100 MG PO CAPS: ORAL_CAPSULE | ORAL | Status: DC

## 2012-03-02 NOTE — Progress Notes (Signed)
Natalie Reyes 04-Nov-1950 [id number]        61 y.o.  [id number] for annual exam.  Several issues noted below.  Past medical history,surgical history, medications, allergies, family history and social history were all reviewed and documented in the EPIC chart. ROS:  Was performed and pertinent positives and negatives are included in the history.  Exam: Sherrilyn Rist assistant Filed Vitals:   03/02/12 1556  Height: 5' 7.5" (1.715 m)  Weight: 113 lb (51.256 kg)   General appearance  Normal Skin grossly normal Head/Neck normal with no cervical or supraclavicular adenopathy thyroid normal Lungs  clear Cardiac RR, without RMG Abdominal  soft, nontender, without masses, organomegaly or hernia.  Right groin with palpable graft no pulse. Breasts  examined lying and sitting without masses, retractions, discharge or axillary adenopathy.  Bilateral implants. Pelvic  Ext/BUS/vagina  normal with atrophic genital changes  Adnexa  Without masses or tenderness    Anus and perineum  normal   Rectovaginal  normal sphincter tone without palpated masses or tenderness.    Assessment/Plan:  61 y.o. [id number] female for annual exam.   1. Menopausal symptoms. Patient continues with some hot flashes. We've discussed treatment options and she declined and would prefer just to monitor. 2. Osteoporosis. Patient never had bone density last year as I recommended. Broke her foot after a traumatic fall this year.  Last DEXA 01/2008 with T score 3.7. I offered and recommended DEXA now but she declined due to finances. I again strongly recommended she start medication. Had been on Boniva previously but stopped it. I again reviewed options to include bisphosphonates Prolia Forteo. Side effects and risks reviewed to include GERD esophageal cancer osteonecrosis of the jaw atypical fractures. After lengthy discussion she was started on Fosamax 70 mg weekly. Instructions on how to take it discussed. Again I recommended DEXA now but she  said I know it's going to be bad and will plan after being on the Fosamax to get a baseline hopefully next year. Check baseline TSH vitamin D. She is on extra calcium vitamin D daily. 3. Post coital cystitis. Patient uses Macrobid postcoitally to prevent infections and I refilled her #30 with 2 refills. 4. Pap smear 02/2011. No Pap smear done today. No history of significant abnormal Pap smears. Options to stop altogether as she is status post hysterectomy versus less frequent screening reviewed. We'll readdress on an annual basis. 5. Mammography 06/2011. Continue with annual mammography. SBE monthly reviewed. 6. Colonoscopy 2013. 7. Health maintenance. No recent blood work done. We'll check baseline lipid profile CBC comprehensive metabolic panel urinalysis along with her vitamin D and TSH.  Follow up one year, sooner as needed.    Dara Lords MD, 4:41 PM 03/02/2012

## 2012-03-02 NOTE — Patient Instructions (Signed)
Start on Fosamax weekly as instructed. Need to have an empty stomach for one hour before taking and not eating or drinking for one hour afterwards. Follow up in one year for exam, sooner as needed.

## 2012-03-03 LAB — LIPID PANEL
Cholesterol: 191 mg/dL (ref 0–200)
HDL: 56 mg/dL (ref >39)
LDL Cholesterol: 123 mg/dL — ABNORMAL HIGH (ref 0–99)
Total CHOL/HDL Ratio: 3.4 Ratio
Triglycerides: 58 mg/dL (ref <150)
VLDL: 12 mg/dL (ref 0–40)

## 2012-03-03 LAB — COMPREHENSIVE METABOLIC PANEL
ALT: 12 U/L (ref 0–35)
AST: 22 U/L (ref 0–37)
Albumin: 5 g/dL (ref 3.5–5.2)
Alkaline Phosphatase: 64 U/L (ref 39–117)
BUN: 12 mg/dL (ref 6–23)
CO2: 29 mEq/L (ref 19–32)
Calcium: 9.7 mg/dL (ref 8.4–10.5)
Chloride: 101 mEq/L (ref 96–112)
Creat: 0.61 mg/dL (ref 0.50–1.10)
Glucose, Bld: 103 mg/dL — ABNORMAL HIGH (ref 70–99)
Potassium: 4.8 mEq/L (ref 3.5–5.3)
Sodium: 139 mEq/L (ref 135–145)
Total Bilirubin: 0.8 mg/dL (ref 0.3–1.2)
Total Protein: 7.3 g/dL (ref 6.0–8.3)

## 2012-03-03 LAB — URINALYSIS W MICROSCOPIC + REFLEX CULTURE
Bacteria, UA: NEGATIVE
Bilirubin Urine: NEGATIVE
Casts: NONE SEEN
Glucose, UA: NEGATIVE mg/dL
Hgb urine dipstick: NEGATIVE
Nitrite: NEGATIVE
Protein, ur: NEGATIVE mg/dL
Specific Gravity, Urine: 1.03 (ref 1.005–1.030)
Urobilinogen, UA: 0.2 mg/dL (ref 0.0–1.0)
pH: 6 (ref 5.0–8.0)

## 2012-03-03 LAB — TSH: TSH: 3.714 u[IU]/mL (ref 0.350–4.500)

## 2012-03-03 LAB — VITAMIN D 25 HYDROXY (VIT D DEFICIENCY, FRACTURES): Vit D, 25-Hydroxy: 46 ng/mL (ref 30–89)

## 2012-03-04 LAB — URINE CULTURE
Colony Count: NO GROWTH
Organism ID, Bacteria: NO GROWTH

## 2012-03-18 ENCOUNTER — Telehealth: Payer: Self-pay | Admitting: Pharmacist

## 2012-03-18 NOTE — Telephone Encounter (Signed)
Last seen in Coumadin clinic 11/17/11. Pt stopped taking Coumadin in October; not sure she's ever resumed. If pt has decided to stop Coumadin altogether, we should discharge her from our Coumadin clinic after Dr. Cyndie Chime agrees for [place] to do so. Marily Lente, Pharm.D.

## 2012-04-11 ENCOUNTER — Telehealth: Payer: Self-pay | Admitting: Pharmacist

## 2012-04-11 ENCOUNTER — Encounter: Payer: Self-pay | Admitting: Pharmacist

## 2012-04-11 NOTE — Telephone Encounter (Signed)
I received voicemail from pt at 2:12 pm at [facility] today. Pt called at 2:07 pm. When I returned her call at 2:14 pm, pt did not answer so I left her a voicemail at her home # & on her cell #. Would like to hear from pt re: anticoag.  If she does not call me back today, I will try her later tomorrow or one day this week. Marily Lente, Pharm.D.

## 2012-04-11 NOTE — Progress Notes (Signed)
I spoke w/ Natalie Reyes over the phone today.  She never restarted her Coumadin after October. She states Dr. Cleophas Dunker told her it was ok to come off the Coumadin, otherwise she was "never going to heal" from her fracture. She did see Dr. Hart Rochester who she says is aware her graft has occluded & was ok with her stopping her Coumadin 3 years ago.  However, she is not interested in having a doppler done because doesn't want to get a "bunch of expensive tests" done that her Insurance won't pay for. I will relay this information to Dr. Cyndie Chime.  We will discharge her from our Coumadin clinic at [facility]. Marily Lente, Pharm.D.

## 2012-10-04 ENCOUNTER — Other Ambulatory Visit: Payer: Self-pay | Admitting: Women's Health

## 2012-10-31 ENCOUNTER — Other Ambulatory Visit: Payer: Self-pay | Admitting: *Deleted

## 2012-10-31 DIAGNOSIS — I749 Embolism and thrombosis of unspecified artery: Secondary | ICD-10-CM

## 2012-10-31 DIAGNOSIS — Z7901 Long term (current) use of anticoagulants: Secondary | ICD-10-CM

## 2012-11-01 ENCOUNTER — Ambulatory Visit (HOSPITAL_BASED_OUTPATIENT_CLINIC_OR_DEPARTMENT_OTHER): Payer: BC Managed Care – PPO | Admitting: Oncology

## 2012-11-01 ENCOUNTER — Other Ambulatory Visit (HOSPITAL_BASED_OUTPATIENT_CLINIC_OR_DEPARTMENT_OTHER): Payer: BC Managed Care – PPO | Admitting: Lab

## 2012-11-01 VITALS — BP 137/69 | HR 70 | Temp 97.9°F | Resp 18 | Ht 67.0 in | Wt 118.6 lb

## 2012-11-01 DIAGNOSIS — Z86718 Personal history of other venous thrombosis and embolism: Secondary | ICD-10-CM

## 2012-11-01 DIAGNOSIS — Z8589 Personal history of malignant neoplasm of other organs and systems: Secondary | ICD-10-CM

## 2012-11-01 DIAGNOSIS — I749 Embolism and thrombosis of unspecified artery: Secondary | ICD-10-CM

## 2012-11-01 DIAGNOSIS — C499 Malignant neoplasm of connective and soft tissue, unspecified: Secondary | ICD-10-CM

## 2012-11-01 DIAGNOSIS — Z7901 Long term (current) use of anticoagulants: Secondary | ICD-10-CM

## 2012-11-01 LAB — COMPREHENSIVE METABOLIC PANEL (CC13)
ALT: 14 U/L (ref 0–55)
AST: 21 U/L (ref 5–34)
Albumin: 4.4 g/dL (ref 3.5–5.0)
Alkaline Phosphatase: 61 U/L (ref 40–150)
BUN: 14.8 mg/dL (ref 7.0–26.0)
CO2: 27 mEq/L (ref 22–29)
Calcium: 9.8 mg/dL (ref 8.4–10.4)
Chloride: 104 mEq/L (ref 98–109)
Creatinine: 0.8 mg/dL (ref 0.6–1.1)
Glucose: 99 mg/dl (ref 70–140)
Potassium: 4.9 mEq/L (ref 3.5–5.1)
Sodium: 140 mEq/L (ref 136–145)
Total Bilirubin: 0.97 mg/dL (ref 0.20–1.20)
Total Protein: 7.7 g/dL (ref 6.4–8.3)

## 2012-11-01 LAB — CBC WITH DIFFERENTIAL/PLATELET
BASO%: 1 % (ref 0.0–2.0)
Basophils Absolute: 0 10*3/uL (ref 0.0–0.1)
EOS%: 2 % (ref 0.0–7.0)
Eosinophils Absolute: 0.1 10*3/uL (ref 0.0–0.5)
HCT: 40.2 % (ref 34.8–46.6)
HGB: 13.3 g/dL (ref 11.6–15.9)
LYMPH%: 38.6 % (ref 14.0–49.7)
MCH: 29.2 pg (ref 25.1–34.0)
MCHC: 33.2 g/dL (ref 31.5–36.0)
MCV: 88.1 fL (ref 79.5–101.0)
MONO#: 0.6 10*3/uL (ref 0.1–0.9)
MONO%: 14.1 % — ABNORMAL HIGH (ref 0.0–14.0)
NEUT#: 1.9 10*3/uL (ref 1.5–6.5)
NEUT%: 44.3 % (ref 38.4–76.8)
Platelets: 198 10*3/uL (ref 145–400)
RBC: 4.56 10*6/uL (ref 3.70–5.45)
RDW: 13.2 % (ref 11.2–14.5)
WBC: 4.3 10*3/uL (ref 3.9–10.3)
lymph#: 1.6 10*3/uL (ref 0.9–3.3)

## 2012-11-02 NOTE — Progress Notes (Signed)
Hematology and Oncology Follow Up Visit  Natalie Reyes [id number] Oct 05, 1950 62 y.o. 11/02/2012 2:39 PM   Principle Diagnosis: Encounter Diagnoses  Name Primary?  Marland Kitchen Arterial thrombosis Yes  . 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 She required arterial vascular grafting. She had problems for many years with recurrent graft occlusion requiring repeat surgeries and redo bypass procedures. I put her on chronic therapeutic Coumadin and she has had no subsequent thrombotic events since the last embolectomy procedure done in September 1995. Despite her impression that she does not require anticoagulation due to graft occlusion, I strongly recommended that she continue the anticoagulation. She has a number of Dacron grafts. These are going to act as a foreign body and likely be the nidus for stimulating new clot formation. She has been pressuring me for years to stop her Coumadin. She finally found her opportunity to do this. She recently fractured the bones in her right foot. She developed a large hematoma. She was initially being considered for surgery but the surgeon wanted to wait until the hematoma resolve. She stopped her Coumadin. The hematoma did resolve but surgery wasn't required. She elected not to go back on the Coumadin. Subsequent to fracturing her right foot, she developed a staph infection of her right great toe and ultimately had to have the toenail removed for the infection to heal.    Medications: reviewed  Allergies: No Known Allergies  Review of Systems: Constitutional:  No constitutional symptoms  Respiratory: No cough or dyspnea Cardiovascular: No chest pain or palpitations  Gastrointestinal: No abdominal pain or change in bowel habit Genito-Urinary: No urinary tract  symptoms Musculoskeletal: See above Neurologic: No headache or change in vision Skin: See above Remaining ROS negative.  Physical Exam: Blood pressure 137/69, pulse 70, temperature 97.9 F (36.6 C), temperature source Oral, resp. rate 18, height 5\' 7"  (1.702 m), weight 118 lb 9.6 oz (53.797 kg), last menstrual period 02/24/1991. Wt Readings from Last 3 Encounters:  11/01/12 118 lb 9.6 oz (53.797 kg)  03/02/12 113 lb (51.256 kg)  10/30/11 122 lb (55.339 kg)     General appearance: Thin Caucasian woman HENNT: Pharynx no erythema or exudate Lymph nodes: No lymphadenopathy Breasts: Lungs: Clear to auscultation resonant to percussion Heart: Regular rhythm no murmur Abdomen: Soft, nontender, no mass, no organomegaly Extremities: No edema, no calf tenderness Musculoskeletal: No joint deformities GU: Vascular: I am able to palpate femoral pulses bilaterally. The right femoral pulse is medial to a presumably occluded vascular graft. Dorsalis pedis pulses 2+ on the left and for the first time in many years, I am able to palpate a weak pulse on the right. Posterior tibial pulses absent but symmetric. Neurologic: Grossly normal Skin: No rash or ecchymosis  Lab Results: Lab Results  Component Value Date   WBC 4.3 11/01/2012   HGB 13.3 11/01/2012   HCT 40.2 11/01/2012   MCV 88.1 11/01/2012   PLT 198 11/01/2012     Chemistry      Component Value Date/Time   NA 140 11/01/2012 1436   NA 139 03/02/2012 1635   K 4.9 11/01/2012 1436   K 4.8 03/02/2012 1635   CL 101 03/02/2012 1635   CO2 27 11/01/2012 1436   CO2 29 03/02/2012 1635   BUN 14.8 11/01/2012 1436   BUN 12 03/02/2012  1635   CREATININE 0.8 11/01/2012 1436   CREATININE 0.61 03/02/2012 1635   CREATININE 0.83 10/28/2011 1402      Component Value Date/Time   CALCIUM 9.8 11/01/2012 1436   CALCIUM 9.7 03/02/2012 1635   ALKPHOS 61 11/01/2012 1436   ALKPHOS 64 03/02/2012 1635   AST 21 11/01/2012 1436   AST 22 03/02/2012 1635   ALT 14  11/01/2012 1436   ALT 12 03/02/2012 1635   BILITOT 0.97 11/01/2012 1436   BILITOT 0.8 03/02/2012 1635       Impression: #1. Cured liposarcoma of right thigh. Now at 25 years from diagnosis and treatment.  #2. 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 Since she has a palpable right femoral pulse and a dorsalis pedis pulse I think it is reasonable at this point to allow her to stay off the Coumadin. I recommended that she start aspirin 81 mg daily.   #3. Status post bilateral breast implants complicated by significant blood loss due to tight anticoagulation around the procedure 11/2004.  #4. History of mitral valve prolapse.  #5. Status post excision of a benign lipoma left lower back 11/14/2010.  Since her main reason for coming to this office was to monitor her Coumadin, and she is now stopped her Coumadin, I told her she can graduate from our practice at this time. I will be happy to see her again in the future if the need arises.      CC:.    Levert Feinstein, MD 8/13/20142:39 PM

## 2012-12-02 ENCOUNTER — Other Ambulatory Visit: Payer: Self-pay | Admitting: Dermatology

## 2012-12-22 ENCOUNTER — Other Ambulatory Visit: Payer: Self-pay | Admitting: Gynecology

## 2012-12-22 DIAGNOSIS — Z1231 Encounter for screening mammogram for malignant neoplasm of breast: Secondary | ICD-10-CM

## 2013-01-06 ENCOUNTER — Ambulatory Visit (HOSPITAL_COMMUNITY): Payer: BC Managed Care – PPO

## 2013-01-11 ENCOUNTER — Ambulatory Visit (HOSPITAL_COMMUNITY): Payer: BC Managed Care – PPO

## 2013-01-25 ENCOUNTER — Ambulatory Visit (HOSPITAL_COMMUNITY): Payer: BC Managed Care – PPO

## 2013-03-09 ENCOUNTER — Other Ambulatory Visit: Payer: Self-pay | Admitting: Women's Health

## 2013-04-25 ENCOUNTER — Ambulatory Visit (INDEPENDENT_AMBULATORY_CARE_PROVIDER_SITE_OTHER): Payer: BC Managed Care – PPO | Admitting: Gynecology

## 2013-04-25 ENCOUNTER — Encounter: Payer: Self-pay | Admitting: Gynecology

## 2013-04-25 ENCOUNTER — Other Ambulatory Visit (HOSPITAL_COMMUNITY)
Admission: RE | Admit: 2013-04-25 | Discharge: 2013-04-25 | Disposition: A | Discharge status: Home / Self Care | Payer: BC Managed Care – PPO | Source: Ambulatory Visit | Attending: Gynecology | Admitting: Gynecology

## 2013-04-25 VITALS — BP 112/66 | Ht 66.5 in | Wt 118.0 lb

## 2013-04-25 DIAGNOSIS — Z01419 Encounter for gynecological examination (general) (routine) without abnormal findings: Secondary | ICD-10-CM

## 2013-04-25 DIAGNOSIS — M810 Age-related osteoporosis without current pathological fracture: Secondary | ICD-10-CM

## 2013-04-25 NOTE — Progress Notes (Signed)
Natalie Reyes 10/17/1950 [id number]        62 y.o.  [id number] for annual exam.  Several issues that are below.  Past medical history,surgical history, problem list, medications, allergies, family history and social history were all reviewed and documented in the EPIC chart.  ROS:  Performed and pertinent positives and negatives are included in the history, assessment and plan .  Exam: Kim assistant Filed Vitals:   04/25/13 1504  BP: 112/66  Height: 5' 6.5" (1.689 m)  Weight: 118 lb (53.524 kg)   General appearance  Normal Skin grossly normal Head/Neck normal with no cervical or supraclavicular adenopathy thyroid normal Lungs  clear Cardiac RR, without RMG Abdominal  soft, nontender, without masses, organomegaly or hernia Breasts  examined lying and sitting without masses, retractions, discharge or axillary adenopathy. Bilateral implants noted Pelvic  Ext/BUS/vagina  Normal with generalized atrophic changes. Pap test done  Adnexa  Without masses or tenderness    Anus and perineum  Normal   Rectovaginal  Normal sphincter tone without palpated masses or tenderness.    Assessment/Plan:  63 y.o. [id number] female for annual exam.   1. Postmenopausal/atrophic genital changes. Overall doing well without significant hot flushes, night sweats, vaginal dryness. Is not sexually active. 2. Osteoporosis.  DEXA 01/2008 with T score -3.7. Had been on Boniva several years ago but stopped it. Discussed Fosamax last year but she never started this as we had planned and never did a repeat DEXA. I again discussed the need to get a baseline DEXA now she agrees to do so. Risks of fracture reviewed and I again recommended Fosamax. We've discussed the risks to include osteonecrosis of the jaw and atypical fractures as well as GERD and esophageal cancer. Proper usage as far as staying upright and not eating or drinking surrounding the pill taking. Followup if any issues. Followup for the DEXA results. Asked her  to have a vitamin D level drawn when she does her lab work from her primary physician's office. 3. Pap smear 2012. Pap smear done today. Is status post vaginal hysterectomy but had sarcoma with pelvic radiation. We'll plan to continue Pap smear surveillance. 4. Mammography 06/2011. Overdue and need for mammogram stressed. SBE monthly reviewed. 5. Colonoscopy 2013. Repeat at their recommended interval. 6. Health maintenance. No blood work done as she reports this all done through her primary physician's office. Followup for bone density otherwise one year.   Note: This document was prepared with digital dictation and possible smart phrase technology. Any transcriptional errors that result from this process are unintentional.   Anastasio Auerbach MD, 3:41 PM 04/25/2013

## 2013-04-25 NOTE — Addendum Note (Signed)
Addended by: Nelva Nay on: 04/25/2013 03:49 PM   Modules accepted: Orders

## 2013-04-25 NOTE — Patient Instructions (Addendum)
Start on Fosamax as we discussed. Followup for bone density as scheduled. Followup for mammogram on an annual basis.

## 2013-04-26 LAB — URINALYSIS W MICROSCOPIC + REFLEX CULTURE
Bacteria, UA: NONE SEEN
Bilirubin Urine: NEGATIVE
Casts: NONE SEEN
Glucose, UA: NEGATIVE mg/dL
Hgb urine dipstick: NEGATIVE
Leukocytes, UA: NEGATIVE
Nitrite: NEGATIVE
Protein, ur: NEGATIVE mg/dL
Specific Gravity, Urine: 1.016 (ref 1.005–1.030)
Squamous Epithelial / LPF: NONE SEEN
Urobilinogen, UA: 0.2 mg/dL (ref 0.0–1.0)
pH: 5.5 (ref 5.0–8.0)

## 2013-05-01 ENCOUNTER — Other Ambulatory Visit: Payer: Self-pay | Admitting: *Deleted

## 2013-05-01 DIAGNOSIS — Z1231 Encounter for screening mammogram for malignant neoplasm of breast: Secondary | ICD-10-CM

## 2013-05-21 ENCOUNTER — Encounter: Payer: Self-pay | Admitting: Oncology

## 2013-08-03 ENCOUNTER — Other Ambulatory Visit: Payer: Self-pay | Admitting: Women's Health

## 2013-08-10 ENCOUNTER — Telehealth: Payer: Self-pay | Admitting: *Deleted

## 2013-08-10 NOTE — Telephone Encounter (Signed)
She could try valtrex 500 bid for 3-5 days as needed. It is generic and very effective and often only need to take few doses to block outbreak if taken early

## 2013-08-10 NOTE — Telephone Encounter (Signed)
(  TF patient) Pt has been taking Zovirax 5% ointment last filled on 08/03/13. This medication has been increased in price, pt asked if anything else she could use? She doesn't like taking pills, because she only takes PRN. Please advise

## 2013-08-10 NOTE — Telephone Encounter (Signed)
Left message for pt to call.

## 2013-08-11 MED ORDER — VALACYCLOVIR HCL 500 MG PO TABS: 500.0000 mg | ORAL_TABLET | Freq: Two times a day (BID) | ORAL | Status: DC

## 2013-08-11 NOTE — Telephone Encounter (Signed)
Patient informed. Rx sent 

## 2013-10-30 NOTE — Telephone Encounter (Signed)
Phone call - encounter closed. 

## 2013-11-16 NOTE — Telephone Encounter (Signed)
Encounter was telephone call. 

## 2014-01-22 ENCOUNTER — Encounter: Payer: Self-pay | Admitting: Gynecology

## 2014-07-23 ENCOUNTER — Encounter: Payer: Self-pay | Admitting: Gynecology

## 2014-08-31 ENCOUNTER — Ambulatory Visit (INDEPENDENT_AMBULATORY_CARE_PROVIDER_SITE_OTHER): Payer: Self-pay | Admitting: Gynecology

## 2014-08-31 ENCOUNTER — Encounter: Payer: Self-pay | Admitting: Gynecology

## 2014-08-31 VITALS — BP 116/74 | Ht 68.0 in | Wt 115.0 lb

## 2014-08-31 DIAGNOSIS — M810 Age-related osteoporosis without current pathological fracture: Secondary | ICD-10-CM

## 2014-08-31 DIAGNOSIS — Z01419 Encounter for gynecological examination (general) (routine) without abnormal findings: Secondary | ICD-10-CM

## 2014-08-31 DIAGNOSIS — N952 Postmenopausal atrophic vaginitis: Secondary | ICD-10-CM

## 2014-08-31 NOTE — Progress Notes (Signed)
Natalie Reyes 11-02-1950 [id number]        64 y.o.  [id number] for annual exam.  Several issues noted below.  Past medical history,surgical history, problem list, medications, allergies, family history and social history were all reviewed and documented as reviewed in the EPIC chart.  ROS:  Performed with pertinent positives and negatives included in the history, assessment and plan.   Additional significant findings :  none   Exam: Natalie Reyes Vitals:   08/31/14 1526  BP: 116/74  Height: 5\' 8"  (1.727 m)  Weight: 115 lb (52.164 kg)   General appearance:  Normal affect, orientation and appearance. Skin: Grossly normal HEENT: Without gross lesions.  No cervical or supraclavicular adenopathy. Thyroid normal.  Lungs:  Clear without wheezing, rales or rhonchi Cardiac: RR, without RMG Abdominal:  Soft, nontender, without masses, guarding, rebound, organomegaly or hernia Breasts:  Examined lying and sitting without masses, retractions, discharge or axillary adenopathy.  Bilateral implants noted Pelvic:  Ext/BUS/vagina with atrophic changes  Adnexa  Without masses or tenderness    Anus and perineum  Normal   Rectovaginal  Normal sphincter tone without palpated masses or tenderness.    Assessment/Plan:  64 y.o. [id number] female for annual exam.   1. Postmenopausal/atrophic genital changes.  Status post [facility] 1995. Without significant hot flashes, night sweats or vaginal dryness. Continue to monitor and report any issues. 2. Osteoporosis. DEXA 2009 T score - 3.7.  Have been on Boniva several years ago but stopped it. I discussed treatment with her on multiple occasions over the last several years. Options to include Fosamax, reinitiation Boniva, Prolia discussed. Patient was supposed to start on Fosamax several times and never did this. Also talked to her multiple times about repeating her bone density but she has always declined or failed to return for this. I again strongly recommended  that she get a baseline bone density and start on Fosamax. I again discussed the side effects and risks to include GERD, osteonecrosis of jaw and atypical fractures. Patient agrees to bone density now but declines treatment at this point. Significant increased risk of fracture viewed with her. I do not have copies of her old pre-Epic chart and I also wanted to recheck secondary cause labs to include TSH PTH vitamin D, comprehensive metabolic panel and 75-TZGY urine for calcium excretion. Patient does not want to have drawn today but agrees to return fasting so she can also have a cholesterol and these were all put in for future orders. It was strongly stressed that this is important to screen her for these as she could have a correctable cause as well as to start on the Fosamax. She says that she wants to do more reading about this before starting and she'll call me when she is ready to start.  3. Mammography 06/2014. Continue the mammography. SBE reviewed. 4. Pap Smear 2015. No Pap smear done today. No history of significant abnormal Pap smears.due to her pelvic radiation though will plan on continuing surveillance less frequent interval. 5. Colonoscopy 2013. Repeat at their recommended interval. 6. Health maintenance. Baseline CBC, has a metabolic panel lipid profile urinalysis TSH PTH vitamin D 24-hour urine calcium ordered.  Follow up for bone density as scheduled call when she is ready to initiate treatment for her osteoporosis     Anastasio Auerbach MD, 4:22 PM 08/31/2014

## 2014-08-31 NOTE — Patient Instructions (Addendum)
Follow up for blood work as ordered. Follow up for bone density as ordered. Follow up with me to consider starting on Fosamax as we discussed.  You may obtain a copy of any labs that were done today by logging onto MyChart as outlined in the instructions provided with your AVS (after visit summary). The office will not call with normal lab results but certainly if there are any significant abnormalities then we will contact you.   Health Maintenance, Female A healthy lifestyle and preventative care can promote health and wellness.  Maintain regular health, dental, and eye exams.  Eat a healthy diet. Foods like vegetables, fruits, whole grains, low-fat dairy products, and lean protein foods contain the nutrients you need without too many calories. Decrease your intake of foods high in solid fats, added sugars, and salt. Get information about a proper diet from your caregiver, if necessary.  Regular physical exercise is one of the most important things you can do for your health. Most adults should get at least 150 minutes of moderate-intensity exercise (any activity that increases your heart rate and causes you to sweat) each week. In addition, most adults need muscle-strengthening exercises on 2 or more days a week.   Maintain a healthy weight. The body mass index (BMI) is a screening tool to identify possible weight problems. It provides an estimate of body fat based on height and weight. Your caregiver can help determine your BMI, and can help you achieve or maintain a healthy weight. For adults 20 years and older:  A BMI below 18.5 is considered underweight.  A BMI of 18.5 to 24.9 is normal.  A BMI of 25 to 29.9 is considered overweight.  A BMI of 30 and above is considered obese.  Maintain normal blood lipids and cholesterol by exercising and minimizing your intake of saturated fat. Eat a balanced diet with plenty of fruits and vegetables. Blood tests for lipids and cholesterol should  begin at age 26 and be repeated every 5 years. If your lipid or cholesterol levels are high, you are over 50, or you are a high risk for heart disease, you may need your cholesterol levels checked more frequently.Ongoing high lipid and cholesterol levels should be treated with medicines if diet and exercise are not effective.  If you smoke, find out from your caregiver how to quit. If you do not use tobacco, do not start.  Lung cancer screening is recommended for adults aged 22 80 years who are at high risk for developing lung cancer because of a history of smoking. Yearly low-dose computed tomography (CT) is recommended for people who have at least a 30-pack-year history of smoking and are a current smoker or have quit within the past 15 years. A pack year of smoking is smoking an average of 1 pack of cigarettes a day for 1 year (for example: 1 pack a day for 30 years or 2 packs a day for 15 years). Yearly screening should continue until the smoker has stopped smoking for at least 15 years. Yearly screening should also be stopped for people who develop a health problem that would prevent them from having lung cancer treatment.  If you are pregnant, do not drink alcohol. If you are breastfeeding, be very cautious about drinking alcohol. If you are not pregnant and choose to drink alcohol, do not exceed 1 drink per day. One drink is considered to be 12 ounces (355 mL) of beer, 5 ounces (148 mL) of wine, or 1.5 ounces (  44 mL) of liquor.  Avoid use of street drugs. Do not share needles with anyone. Ask for help if you need support or instructions about stopping the use of drugs.  High blood pressure causes heart disease and increases the risk of stroke. Blood pressure should be checked at least every 1 to 2 years. Ongoing high blood pressure should be treated with medicines, if weight loss and exercise are not effective.  If you are 61 to 62 years old, ask your caregiver if you should take aspirin to  prevent strokes.  Diabetes screening involves taking a blood sample to check your fasting blood sugar level. This should be done once every 3 years, after age 51, if you are within normal weight and without risk factors for diabetes. Testing should be considered at a younger age or be carried out more frequently if you are overweight and have at least 1 risk factor for diabetes.  Breast cancer screening is essential preventative care for women. You should practice "breast self-awareness." This means understanding the normal appearance and feel of your breasts and may include breast self-examination. Any changes detected, no matter how small, should be reported to a caregiver. Women in their 53s and 30s should have a clinical breast exam (CBE) by a caregiver as part of a regular health exam every 1 to 3 years. After age 61, women should have a CBE every year. Starting at age 61, women should consider having a mammogram (breast X-ray) every year. Women who have a family history of breast cancer should talk to their caregiver about genetic screening. Women at a high risk of breast cancer should talk to their caregiver about having an MRI and a mammogram every year.  Breast cancer gene (BRCA)-related cancer risk assessment is recommended for women who have family members with BRCA-related cancers. BRCA-related cancers include breast, ovarian, tubal, and peritoneal cancers. Having family members with these cancers may be associated with an increased risk for harmful changes (mutations) in the breast cancer genes BRCA1 and BRCA2. Results of the assessment will determine the need for genetic counseling and BRCA1 and BRCA2 testing.  The Pap test is a screening test for cervical cancer. Women should have a Pap test starting at age 85. Between ages 40 and 25, Pap tests should be repeated every 2 years. Beginning at age 83, you should have a Pap test every 3 years as long as the past 3 Pap tests have been normal. If  you had a hysterectomy for a problem that was not cancer or a condition that could lead to cancer, then you no longer need Pap tests. If you are between ages 41 and 67, and you have had normal Pap tests going back 10 years, you no longer need Pap tests. If you have had past treatment for cervical cancer or a condition that could lead to cancer, you need Pap tests and screening for cancer for at least 20 years after your treatment. If Pap tests have been discontinued, risk factors (such as a new sexual partner) need to be reassessed to determine if screening should be resumed. Some women have medical problems that increase the chance of getting cervical cancer. In these cases, your caregiver may recommend more frequent screening and Pap tests.  The human papillomavirus (HPV) test is an additional test that may be used for cervical cancer screening. The HPV test looks for the virus that can cause the cell changes on the cervix. The cells collected during the Pap test can  be tested for HPV. The HPV test could be used to screen women aged 72 years and older, and should be used in women of any age who have unclear Pap test results. After the age of 54, women should have HPV testing at the same frequency as a Pap test.  Colorectal cancer can be detected and often prevented. Most routine colorectal cancer screening begins at the age of 35 and continues through age 43. However, your caregiver may recommend screening at an earlier age if you have risk factors for colon cancer. On a yearly basis, your caregiver may provide home test kits to check for hidden blood in the stool. Use of a small camera at the end of a tube, to directly examine the colon (sigmoidoscopy or colonoscopy), can detect the earliest forms of colorectal cancer. Talk to your caregiver about this at age 59, when routine screening begins. Direct examination of the colon should be repeated every 5 to 10 years through age 30, unless early forms of  pre-cancerous polyps or small growths are found.  Hepatitis C blood testing is recommended for all people born from [birth year] through 1963 and any individual with known risks for hepatitis C.  Practice safe sex. Use condoms and avoid high-risk sexual practices to reduce the spread of sexually transmitted infections (STIs). Sexually active women aged 58 and younger should be checked for Chlamydia, which is a common sexually transmitted infection. Older women with new or multiple partners should also be tested for Chlamydia. Testing for other STIs is recommended if you are sexually active and at increased risk.  Osteoporosis is a disease in which the bones lose minerals and strength with aging. This can result in serious bone fractures. The risk of osteoporosis can be identified using a bone density scan. Women ages 16 and over and women at risk for fractures or osteoporosis should discuss screening with their caregivers. Ask your caregiver whether you should be taking a calcium supplement or vitamin D to reduce the rate of osteoporosis.  Menopause can be associated with physical symptoms and risks. Hormone replacement therapy is available to decrease symptoms and risks. You should talk to your caregiver about whether hormone replacement therapy is right for you.  Use sunscreen. Apply sunscreen liberally and repeatedly throughout the day. You should seek shade when your shadow is shorter than you. Protect yourself by wearing long sleeves, pants, a wide-brimmed hat, and sunglasses year round, whenever you are outdoors.  Notify your caregiver of new moles or changes in moles, especially if there is a change in shape or color. Also notify your caregiver if a mole is larger than the size of a pencil eraser.  Stay current with your immunizations. Document Released: 09/22/2010 Document Revised: 07/04/2012 Document Reviewed: 09/22/2010 [facility] Patient Information 2014 [employer].

## 2014-09-01 LAB — URINALYSIS W MICROSCOPIC + REFLEX CULTURE
Bacteria, UA: NONE SEEN
Bilirubin Urine: NEGATIVE
Casts: NONE SEEN
Crystals: NONE SEEN
Glucose, UA: NEGATIVE mg/dL
Hgb urine dipstick: NEGATIVE
Ketones, ur: NEGATIVE mg/dL
Leukocytes, UA: NEGATIVE
Nitrite: NEGATIVE
Protein, ur: NEGATIVE mg/dL
Specific Gravity, Urine: 1.014 (ref 1.005–1.030)
Squamous Epithelial / LPF: NONE SEEN
Urobilinogen, UA: 0.2 mg/dL (ref 0.0–1.0)
pH: 5 (ref 5.0–8.0)

## 2014-10-02 ENCOUNTER — Other Ambulatory Visit: Payer: Self-pay

## 2014-10-02 MED ORDER — VALACYCLOVIR HCL 500 MG PO TABS: 500.0000 mg | ORAL_TABLET | Freq: Two times a day (BID) | ORAL | Status: DC

## 2014-10-02 NOTE — Telephone Encounter (Signed)
Valtrex prescription sent

## 2015-11-19 ENCOUNTER — Encounter: Payer: Self-pay | Admitting: Gynecology

## 2015-11-19 ENCOUNTER — Ambulatory Visit (INDEPENDENT_AMBULATORY_CARE_PROVIDER_SITE_OTHER): Payer: Self-pay | Admitting: Gynecology

## 2015-11-19 VITALS — BP 116/66 | Ht 66.0 in | Wt 109.0 lb

## 2015-11-19 DIAGNOSIS — Z01419 Encounter for gynecological examination (general) (routine) without abnormal findings: Secondary | ICD-10-CM

## 2015-11-19 DIAGNOSIS — M810 Age-related osteoporosis without current pathological fracture: Secondary | ICD-10-CM

## 2015-11-19 DIAGNOSIS — A609 Anogenital herpesviral infection, unspecified: Secondary | ICD-10-CM

## 2015-11-19 DIAGNOSIS — N952 Postmenopausal atrophic vaginitis: Secondary | ICD-10-CM

## 2015-11-19 MED ORDER — VALACYCLOVIR HCL 500 MG PO TABS: 500.0000 mg | ORAL_TABLET | Freq: Two times a day (BID) | ORAL | 3 refills | Status: DC

## 2015-11-19 NOTE — Patient Instructions (Signed)

## 2015-11-19 NOTE — Progress Notes (Signed)
    Natalie Reyes 1950-09-08 [id number]        65 y.o.  [id number]  for annual exam.  Several issues noted below.  Past medical history,surgical history, problem list, medications, allergies, family history and social history were all reviewed and documented as reviewed in the EPIC chart.  ROS:  Performed with pertinent positives and negatives included in the history, assessment and plan.   Additional significant findings :  None   Exam: [name] assistant Vitals:   11/19/15 1506  BP: 116/66  Weight: 109 lb (49.4 kg)  Height: 5\' 6"  (1.676 m)   Body mass index is 17.59 kg/m.  General appearance:  Normal affect, orientation and appearance. Skin: Grossly normal HEENT: Without gross lesions.  No cervical or supraclavicular adenopathy. Thyroid normal.  Lungs:  Clear without wheezing, rales or rhonchi Cardiac: RR, without RMG Abdominal:  Soft, nontender, without masses, guarding, rebound, organomegaly or hernia.  Graft right groin. Breasts:  Examined lying and sitting without masses, retractions, discharge or axillary adenopathy.  Bilateral implants noted. Pelvic:  Ext/BUS/Vagina with mild atrophic changes  Postmenopausal/atrophic genital changes. Status post [facility] 1995. No significant hot flushes, night sweats phincter tone without palpated masses or tenderness.    Assessment/Plan:  65 y.o. [id number] female for annual exam.   1. Postmenopausal/atrophic genital changes. TVH 1995. No significant hot flushes, night sweats or vaginal dryness. Continue to monitor report any issues. 2. Osteoporosis. DEXA 2009 T score -3.7. Had been on Boniva a number of years ago but stopped it. Was to schedule DEXA last year as well as workup for secondary causes including TSH PTH vitamin D Compro has a metabolic panel and [id number] urine calcium excretion. She never followed up for these. Patient relates she has no insurance and does not want the additional cost. Options to start Fosamax now without recent  bone density also reviewed. Issues with medications to include GERD, osteonecrosis of the jaw, atypical fractures reviewed. Significant increased risk of fracture discussed. At this point the patient will not commit and does not want to schedule her DEXA or any lab work. She does not want to start Fosamax at this time. She clearly understands her significant risk of fracture. 3. History of HSV with occasional outbreaks. Requests refill on her Valtrex. Valtrex 500 mg #20 one by mouth twice a day 5 days with outbreak. Refill 3 provided. 4. Mammography 06/2014. Patient knows she is overdue and agrees to call and schedule. SBE monthly reviewed. 5. Colonoscopy 2013 with reported repeat interval 10 years. 6. Pap smear 2015. No Pap smear done today. No history of significant abnormal Pap smears previously. Will plan repeat Pap smear next year at 3 year interval given history of pelvic radiation. 7. Health maintenance. No routine lab work done at her request. Follow up 1 year, sooner as needed.

## 2015-11-26 ENCOUNTER — Encounter: Payer: Self-pay | Admitting: Gynecology

## 2016-11-19 ENCOUNTER — Encounter: Payer: Self-pay | Admitting: Gynecology

## 2016-11-19 ENCOUNTER — Ambulatory Visit (INDEPENDENT_AMBULATORY_CARE_PROVIDER_SITE_OTHER): Payer: Self-pay | Admitting: Gynecology

## 2016-11-19 VITALS — BP 116/74 | Ht 67.0 in | Wt 119.0 lb

## 2016-11-19 DIAGNOSIS — M810 Age-related osteoporosis without current pathological fracture: Secondary | ICD-10-CM

## 2016-11-19 DIAGNOSIS — N952 Postmenopausal atrophic vaginitis: Secondary | ICD-10-CM

## 2016-11-19 DIAGNOSIS — Z01411 Encounter for gynecological examination (general) (routine) with abnormal findings: Secondary | ICD-10-CM

## 2016-11-19 DIAGNOSIS — R3989 Other symptoms and signs involving the genitourinary system: Secondary | ICD-10-CM

## 2016-11-19 NOTE — Patient Instructions (Addendum)
Follow up for bone density as scheduled  Try AZO Standard for your bladder pain

## 2016-11-19 NOTE — Progress Notes (Signed)
    Natalie Reyes Sep 14, 1950 [id number]        65 y.o.  [id number] for annual exam.  Patient does note some bladder discomfort when she lays flat on her bed at night. Feels like she has to go to the bathroom although she just voided. Sometimes she'll get up and void and goes a fair amount. No frequency, dysuria, low back pain, fever or chills.  Past medical history,surgical history, problem list, medications, allergies, family history and social history were all reviewed and documented as reviewed in the EPIC chart.  ROS:  Performed with pertinent positives and negatives included in the history, assessment and plan.   Additional significant findings :  None   Exam: Natalie Reyes assistant Vitals:   11/19/16 1449  BP: 116/74  Weight: 119 lb (54 kg)  Height: 5\' 7"  (1.702 m)   Body mass index is 18.64 kg/m.  General appearance:  Normal affect, orientation and appearance. Skin: Grossly normal HEENT: Without gross lesions.  No cervical or supraclavicular adenopathy. Thyroid normal.  Lungs:  Clear without wheezing, rales or rhonchi Cardiac: RR, without RMG Abdominal:  Soft, nontender, without masses, guarding, rebound, organomegaly or hernia. Graft right groin region Breasts:  Examined lying and sitting without masses, retractions, discharge or axillary adenopathy.  Bilateral implants noted Pelvic:  Ext, BUS, Vagina: With atrophic changes  Adnexa: Without masses or tenderness    Anus and perineum: Normal   Rectovaginal: Normal sphincter tone without palpated masses or tenderness.    Assessment/Plan:  66 y.o. [id number] female for annual exam.   1. Post menopausal/atrophic genital changes. Status post [place] for bleeding. No significant hot flushes, night sweats, vaginal dryness area 2. Osteoporosis. DEXA 2009 T score - 3.7. Had been on Boniva number of years ago but stopped this. I have been trying to get the patient to do a follow up DEXA but she has always declined. I have also tried  to get her to do secondary workup blood work but she has always declined. I again discussed the issues of osteoporosis and fracture risk particularly at her young age. She is not interested in doing any blood work due to no insurance. She does state that she will schedule a bone density this year and will go ahead and relook at her numbers and then rediscuss treatment options.  3. History of HSV. Uses Valtrex intermittently. Recently refilled her prescription and will call if she needs more. 4. Bladder discomfort when lying flat in bed. No overt symptoms/signs for UTI. Exam is normal. Check urinalysis. Discussed possible trial of AZO standard at bedtime. Will follow up with urology if continues to be an issue. 5. Mammography 11/2015. Schedule mammography this coming fall. Breast exam normal today. Bilateral implants noted. 6. Pap smear 2015. No history of abnormal Pap smears previously. Reviewed current screening guidelines. Options to stop screening based on hysterectomy for benign indications reviewed. Patient is comfortable with stop screening in no Pap smear was done today. 7. Colonoscopy 2013. Repeat at their recommended interval. 8. Health maintenance. No routine lab work done at patient request. Follow up for bone density. Follow up in one year for annual exam   Anastasio Auerbach MD, 3:18 PM 11/19/2016

## 2016-11-20 LAB — URINALYSIS W MICROSCOPIC + REFLEX CULTURE
Bacteria, UA: NONE SEEN [HPF]
Bilirubin Urine: NEGATIVE
Casts: NONE SEEN [LPF]
Glucose, UA: NEGATIVE
Hgb urine dipstick: NEGATIVE
Ketones, ur: NEGATIVE
Leukocytes, UA: NEGATIVE
Nitrite: NEGATIVE
Protein, ur: NEGATIVE
RBC / HPF: NONE SEEN RBC/HPF (ref <=2)
Specific Gravity, Urine: 1.017 (ref 1.001–1.035)
Squamous Epithelial / LPF: NONE SEEN [HPF] (ref <=5)
WBC, UA: NONE SEEN WBC/HPF (ref <=5)
Yeast: NONE SEEN [HPF]
pH: 5 (ref 5.0–8.0)

## 2016-12-01 ENCOUNTER — Encounter: Payer: Self-pay | Admitting: Gynecology

## 2017-11-17 ENCOUNTER — Other Ambulatory Visit: Payer: Self-pay | Admitting: Gynecology

## 2018-04-06 ENCOUNTER — Encounter: Payer: Self-pay | Admitting: Gynecology

## 2018-04-06 ENCOUNTER — Ambulatory Visit (INDEPENDENT_AMBULATORY_CARE_PROVIDER_SITE_OTHER): Payer: Medicare Other | Admitting: Gynecology

## 2018-04-06 VITALS — BP 118/76 | Ht 66.5 in | Wt 122.0 lb

## 2018-04-06 DIAGNOSIS — R351 Nocturia: Secondary | ICD-10-CM

## 2018-04-06 DIAGNOSIS — Z9289 Personal history of other medical treatment: Secondary | ICD-10-CM

## 2018-04-06 DIAGNOSIS — M810 Age-related osteoporosis without current pathological fracture: Secondary | ICD-10-CM

## 2018-04-06 DIAGNOSIS — Z01419 Encounter for gynecological examination (general) (routine) without abnormal findings: Secondary | ICD-10-CM

## 2018-04-06 DIAGNOSIS — N952 Postmenopausal atrophic vaginitis: Secondary | ICD-10-CM

## 2018-04-06 DIAGNOSIS — A609 Anogenital herpesviral infection, unspecified: Secondary | ICD-10-CM

## 2018-04-06 NOTE — Patient Instructions (Signed)
Schedule your bone density.

## 2018-04-06 NOTE — Progress Notes (Signed)
    Natalie Reyes 1950-04-25 [id number]        68 y.o.  [id number] for breast and pelvic exam.  Past medical history,surgical history, problem list, medications, allergies, family history and social history were all reviewed and documented as reviewed in the EPIC chart.  ROS:  Performed with pertinent positives and negatives included in the history, assessment and plan.   Additional significant findings : None   Exam: [name] assistant Vitals:   04/06/18 1610  BP: 118/76  Weight: 122 lb (55.3 kg)  Height: 5' 6.5" (1.689 m)   Body mass index is 19.4 kg/m.  General appearance:  Normal affect, orientation and appearance. Skin: Grossly normal HEENT: Without gross lesions.  No cervical or supraclavicular adenopathy. Thyroid normal.  Lungs:  Clear without wheezing, rales or rhonchi Cardiac: RR, without RMG Abdominal:  Soft, nontender, without masses, guarding, rebound, organomegaly or hernia Breasts:  Examined lying and sitting without masses, retractions, discharge or axillary adenopathy.  Bilateral implants noted Pelvic:  Ext, BUS, Vagina: With atrophic changes  Adnexa: Without masses or tenderness    Anus and perineum: Normal   Rectovaginal: Normal sphincter tone without palpated masses or tenderness.    Assessment/Plan:  68 y.o. [id number] female for breast and pelvic exam.   1. Postmenopausal.  Status post [facility] 1995 for bleeding.  No significant menopausal symptoms. 2. Nocturia.  Feeling bladder discomfort when she lays in bed at night.  Has been going on for years.  No issues during the day.  Will check urine analysis.  Trial of OAB medication discussed with patient's not interested at this time.  We have discussed follow-up with urology before but she is not interested at this time. 3. Osteoporosis.  DEXA 2009 T score -3.7.  History of Boniva a number of years ago but discontinued.  I have asked her multiple times to schedule follow-up DEXA and she has declined.  She again  declines today understanding the issues and fracture risks. 4. Mammography 11/2017.  Continue with annual mammography when due.  Breast exam normal today. 5. Colonoscopy 2013.  Repeat at their recommended interval. 6. Pap smear 2015.  No Pap smear done today.  No history of abnormal Pap smears.  Per current screening guidelines based on hysterectomy history/age the patient prefers to stop screening. 7. Health maintenance.  No routine lab work done as patient reports this to be done elsewhere.  Follow-up 1 year, sooner as needed.   Anastasio Auerbach MD, 4:49 PM 04/06/2018

## 2018-07-20 DIAGNOSIS — L821 Other seborrheic keratosis: Secondary | ICD-10-CM | POA: Diagnosis not present

## 2018-07-20 DIAGNOSIS — C44519 Basal cell carcinoma of skin of other part of trunk: Secondary | ICD-10-CM | POA: Diagnosis not present

## 2018-07-20 DIAGNOSIS — L820 Inflamed seborrheic keratosis: Secondary | ICD-10-CM | POA: Diagnosis not present

## 2018-07-20 DIAGNOSIS — D485 Neoplasm of uncertain behavior of skin: Secondary | ICD-10-CM | POA: Diagnosis not present

## 2018-08-03 DIAGNOSIS — C44519 Basal cell carcinoma of skin of other part of trunk: Secondary | ICD-10-CM | POA: Diagnosis not present

## 2018-12-20 ENCOUNTER — Encounter: Payer: Self-pay | Admitting: Gynecology

## 2018-12-20 DIAGNOSIS — Z1231 Encounter for screening mammogram for malignant neoplasm of breast: Secondary | ICD-10-CM | POA: Diagnosis not present

## 2018-12-28 ENCOUNTER — Encounter: Payer: Self-pay | Admitting: Gynecology

## 2019-01-03 DIAGNOSIS — Z1389 Encounter for screening for other disorder: Secondary | ICD-10-CM | POA: Diagnosis not present

## 2019-01-03 DIAGNOSIS — C499 Malignant neoplasm of connective and soft tissue, unspecified: Secondary | ICD-10-CM | POA: Diagnosis not present

## 2019-04-05 DIAGNOSIS — H2513 Age-related nuclear cataract, bilateral: Secondary | ICD-10-CM | POA: Diagnosis not present

## 2019-04-05 DIAGNOSIS — Z0100 Encounter for examination of eyes and vision without abnormal findings: Secondary | ICD-10-CM | POA: Diagnosis not present

## 2019-04-05 DIAGNOSIS — H02831 Dermatochalasis of right upper eyelid: Secondary | ICD-10-CM | POA: Diagnosis not present

## 2019-04-05 DIAGNOSIS — H524 Presbyopia: Secondary | ICD-10-CM | POA: Diagnosis not present

## 2019-04-05 DIAGNOSIS — H04123 Dry eye syndrome of bilateral lacrimal glands: Secondary | ICD-10-CM | POA: Diagnosis not present

## 2019-04-10 ENCOUNTER — Other Ambulatory Visit: Payer: Self-pay

## 2019-04-11 ENCOUNTER — Ambulatory Visit: Payer: Medicare HMO | Admitting: Women's Health

## 2019-04-11 ENCOUNTER — Encounter: Payer: Self-pay | Admitting: Women's Health

## 2019-04-11 VITALS — BP 124/80 | Ht 67.0 in | Wt 121.0 lb

## 2019-04-11 DIAGNOSIS — Z9189 Other specified personal risk factors, not elsewhere classified: Secondary | ICD-10-CM | POA: Diagnosis not present

## 2019-04-11 DIAGNOSIS — Z9289 Personal history of other medical treatment: Secondary | ICD-10-CM | POA: Diagnosis not present

## 2019-04-11 DIAGNOSIS — Z01419 Encounter for gynecological examination (general) (routine) without abnormal findings: Secondary | ICD-10-CM | POA: Diagnosis not present

## 2019-04-11 NOTE — Patient Instructions (Signed)
shingrex vaccine  Shingles vaccine  2 series  tdap pnuemonia Vit D 2000 iu daily Health Maintenance After Age 67 After age 67, you are at a higher risk for certain long-term diseases and infections as well as injuries from falls. Falls are a major cause of broken bones and head injuries in people who are older than age 67. Getting regular preventive care can help to keep you healthy and well. Preventive care includes getting regular testing and making lifestyle changes as recommended by your health care provider. Talk with your health care provider about:  Which screenings and tests you should have. A screening is a test that checks for a disease when you have no symptoms.  A diet and exercise plan that is right for you. What should I know about screenings and tests to prevent falls? Screening and testing are the best ways to find a health problem early. Early diagnosis and treatment give you the best chance of managing medical conditions that are common after age 67. Certain conditions and lifestyle choices may make you more likely to have a fall. Your health care provider may recommend:  Regular vision checks. Poor vision and conditions such as cataracts can make you more likely to have a fall. If you wear glasses, make sure to get your prescription updated if your vision changes.  Medicine review. Work with your health care provider to regularly review all of the medicines you are taking, including over-the-counter medicines. Ask your health care provider about any side effects that may make you more likely to have a fall. Tell your health care provider if any medicines that you take make you feel dizzy or sleepy.  Osteoporosis screening. Osteoporosis is a condition that causes the bones to get weaker. This can make the bones weak and cause them to break more easily.  Blood pressure screening. Blood pressure changes and medicines to control blood pressure can make you feel dizzy.  Strength  and balance checks. Your health care provider may recommend certain tests to check your strength and balance while standing, walking, or changing positions.  Foot health exam. Foot pain and numbness, as well as not wearing proper footwear, can make you more likely to have a fall.  Depression screening. You may be more likely to have a fall if you have a fear of falling, feel emotionally low, or feel unable to do activities that you used to do.  Alcohol use screening. Using too much alcohol can affect your balance and may make you more likely to have a fall. What actions can I take to lower my risk of falls? General instructions  Talk with your health care provider about your risks for falling. Tell your health care provider if: ? You fall. Be sure to tell your health care provider about all falls, even ones that seem minor. ? You feel dizzy, sleepy, or off-balance.  Take over-the-counter and prescription medicines only as told by your health care provider. These include any supplements.  Eat a healthy diet and maintain a healthy weight. A healthy diet includes low-fat dairy products, low-fat (lean) meats, and fiber from whole grains, beans, and lots of fruits and vegetables. Home safety  Remove any tripping hazards, such as rugs, cords, and clutter.  Install safety equipment such as grab bars in bathrooms and safety rails on stairs.  Keep rooms and walkways well-lit. Activity   Follow a regular exercise program to stay fit. This will help you maintain your balance. Ask your health care  provider what types of exercise are appropriate for you.  If you need a cane or walker, use it as recommended by your health care provider.  Wear supportive shoes that have nonskid soles. Lifestyle  Do not drink alcohol if your health care provider tells you not to drink.  If you drink alcohol, limit how much you have: ? 0-1 drink a day for women. ? 0-2 drinks a day for men.  Be aware of how  much alcohol is in your drink. In the [place] one drink equals one typical bottle of beer (12 oz), one-half glass of wine (5 oz), or one shot of hard liquor (1 oz).  Do not use any products that contain nicotine or tobacco, such as cigarettes and e-cigarettes. If you need help quitting, ask your health care provider. Summary  Having a healthy lifestyle and getting preventive care can help to protect your health and wellness after age 56.  Screening and testing are the best way to find a health problem early and help you avoid having a fall. Early diagnosis and treatment give you the best chance for managing medical conditions that are more common for people who are older than age 67.  Falls are a major cause of broken bones and head injuries in people who are older than age 67. Take precautions to prevent a fall at home.  Work with your health care provider to learn what changes you can make to improve your health and wellness and to prevent falls. This information is not intended to replace advice given to you by your health care provider. Make sure you discuss any questions you have with your health care provider. Document Revised: 06/30/2018 Document Reviewed: 01/20/2017 Elsevier Patient Education  2020 [employer].

## 2019-04-11 NOTE — Progress Notes (Signed)
Natalie Reyes Dec 31, 1950 [id number]    History:    Presents for breast and pelvic exam.  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  Normal Pap and mammogram history.  2013 - colonoscopy.  2009-3.7 Boniva for few years and then stopped and had declined further follow-up or treatment.  Labs-primary care.  Not sexually active in years.    Past medical history, past surgical history, family history and social history were all reviewed and documented in the EPIC chart.  2 children ages 59 and 64 and 2 grandchildren.  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  ROS:  A ROS was performed and pertinent positives and negatives are included.  Exam:  Vitals:   04/11/19 1458  BP: 124/80  Weight: 121 lb (54.9 kg)  Height: _0  (1.702 m)   Body mass index is 18.95 kg/m.   General appearance:  Normal Thyroid:  Symmetrical, normal in size, without palpable masses or nodularity. Respiratory  Auscultation:  Clear without wheezing or rhonchi Cardiovascular  Auscultation:  Regular irregular rate, without murmurs or gallops  Edema/varicosities:  Not grossly evident Abdominal  Soft,nontender, without masses, guarding or rebound.  Liver/spleen:  No organomegaly noted  Hernia:  None appreciated  Skin  Inspection:  Grossly normal   Breasts: Examined lying and sitting.     Right: Without masses, retractions, discharge or axillary adenopathy.     Left: Without masses, retractions, discharge or axillary adenopathy. Gentitourinary   Inguinal/mons:  Normal without inguinal adenopathy  External genitalia:  Normal  BUS/Urethra/Skene's glands:  Normal  Vagina: Atrophic/not sexually active   Cervix: And uterus absent   Adnexa/parametria:     Rt: Without masses or tenderness.   Lt: Without masses or  tenderness.  Anus and perineum: Normal  Digital rectal exam: Normal sphincter tone without palpated masses or tenderness  Assessment/Plan:  69 y.o. D WF G3, P2 for breast and pelvic exam with complaint of irregular heartbeat occasionally without shortness of breath, chest pain, nausea, perspiration or arm pain.  Also states has bladder pressure when lying supine, present for years.  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  Strongly encouraged vaccines, pneumonia vaccine, Tdap, Shingrix.  SBEs, continue annual 3D screening mammogram, calcium rich foods, vitamin D 2000 IUs daily.  Encouraged to repeat DEXA will check with insurance company to see where it is covered, reviewed importance of weightbearing and balance type exercise, yoga, tai chi and Pilates reviewed and encouraged.  Home safety, fall prevention discussed.  Pap screening guidelines reviewed.    Huel Cote [facility], 5:23 PM 04/11/2019

## 2019-04-14 DIAGNOSIS — Z0100 Encounter for examination of eyes and vision without abnormal findings: Secondary | ICD-10-CM | POA: Diagnosis not present

## 2019-04-17 DIAGNOSIS — C499 Malignant neoplasm of connective and soft tissue, unspecified: Secondary | ICD-10-CM | POA: Diagnosis not present

## 2019-04-17 DIAGNOSIS — R69 Illness, unspecified: Secondary | ICD-10-CM | POA: Diagnosis not present

## 2019-04-17 DIAGNOSIS — Z1389 Encounter for screening for other disorder: Secondary | ICD-10-CM | POA: Diagnosis not present

## 2019-04-17 DIAGNOSIS — Z13220 Encounter for screening for lipoid disorders: Secondary | ICD-10-CM | POA: Diagnosis not present

## 2019-04-17 DIAGNOSIS — Z0000 Encounter for general adult medical examination without abnormal findings: Secondary | ICD-10-CM | POA: Diagnosis not present

## 2019-04-17 DIAGNOSIS — M810 Age-related osteoporosis without current pathological fracture: Secondary | ICD-10-CM | POA: Diagnosis not present

## 2019-04-17 DIAGNOSIS — I499 Cardiac arrhythmia, unspecified: Secondary | ICD-10-CM | POA: Diagnosis not present

## 2019-04-17 DIAGNOSIS — Z136 Encounter for screening for cardiovascular disorders: Secondary | ICD-10-CM | POA: Diagnosis not present

## 2019-07-20 DIAGNOSIS — L821 Other seborrheic keratosis: Secondary | ICD-10-CM | POA: Diagnosis not present

## 2019-07-20 DIAGNOSIS — L718 Other rosacea: Secondary | ICD-10-CM | POA: Diagnosis not present

## 2019-07-20 DIAGNOSIS — L814 Other melanin hyperpigmentation: Secondary | ICD-10-CM | POA: Diagnosis not present

## 2019-07-20 DIAGNOSIS — D225 Melanocytic nevi of trunk: Secondary | ICD-10-CM | POA: Diagnosis not present

## 2019-07-20 DIAGNOSIS — D2272 Melanocytic nevi of left lower limb, including hip: Secondary | ICD-10-CM | POA: Diagnosis not present

## 2019-07-20 DIAGNOSIS — D2371 Other benign neoplasm of skin of right lower limb, including hip: Secondary | ICD-10-CM | POA: Diagnosis not present

## 2019-07-20 DIAGNOSIS — D2261 Melanocytic nevi of right upper limb, including shoulder: Secondary | ICD-10-CM | POA: Diagnosis not present

## 2019-07-20 DIAGNOSIS — Z85828 Personal history of other malignant neoplasm of skin: Secondary | ICD-10-CM | POA: Diagnosis not present

## 2019-08-02 DIAGNOSIS — R69 Illness, unspecified: Secondary | ICD-10-CM | POA: Diagnosis not present

## 2019-08-31 DIAGNOSIS — L820 Inflamed seborrheic keratosis: Secondary | ICD-10-CM | POA: Diagnosis not present

## 2019-08-31 DIAGNOSIS — D485 Neoplasm of uncertain behavior of skin: Secondary | ICD-10-CM | POA: Diagnosis not present

## 2019-11-15 DIAGNOSIS — H02834 Dermatochalasis of left upper eyelid: Secondary | ICD-10-CM | POA: Diagnosis not present

## 2019-11-15 DIAGNOSIS — H02831 Dermatochalasis of right upper eyelid: Secondary | ICD-10-CM | POA: Diagnosis not present

## 2019-11-23 DIAGNOSIS — L820 Inflamed seborrheic keratosis: Secondary | ICD-10-CM | POA: Diagnosis not present

## 2019-11-24 ENCOUNTER — Other Ambulatory Visit: Payer: Self-pay | Admitting: *Deleted

## 2019-11-24 MED ORDER — VALACYCLOVIR HCL 500 MG PO TABS: 500.0000 mg | ORAL_TABLET | Freq: Two times a day (BID) | ORAL | 1 refills | Status: DC

## 2020-01-16 DIAGNOSIS — Z0100 Encounter for examination of eyes and vision without abnormal findings: Secondary | ICD-10-CM | POA: Diagnosis not present

## 2020-01-25 DIAGNOSIS — Z1231 Encounter for screening mammogram for malignant neoplasm of breast: Secondary | ICD-10-CM | POA: Diagnosis not present

## 2020-03-25 DIAGNOSIS — Z0100 Encounter for examination of eyes and vision without abnormal findings: Secondary | ICD-10-CM | POA: Diagnosis not present

## 2020-03-25 NOTE — Progress Notes (Signed)
Cardiology Office Note:    Date:  03/29/2020   ID:  Judie Petit, DOB February 15, 1951, MRN [id number]  PCP:  Wenda Low, MD  Cardiologist:  No primary care provider on file.   Referring MD: Wenda Low, MD   Chief Complaint  Patient presents with  . Palpitations    Premature ventricular contractions    History of Present Illness:    Natalie Reyes is a 70 y.o. female with a hx of palpitations/PVCs, background MVP, arterial graft thrombosis.  When lying down on her back and particularly when lying on her left side each morning and at night, she feels irregular heartbeat and pounding.  It is not fast but is noticeable and disturbing.  There is over a 20-year history of palpitations.  Initial evaluation was in the late 1980s.  Evaluation did not reveal significant heart trouble.  Palpitations eventually resolved.  She thought it they were related to having a very bad divorce.  Over the past 2 to 3 years she has been aware of the palpitations and is finally getting it evaluated.  When she is up and moving around she is not aware of irregular heartbeat.  She is not having chest pain.  She has never had syncope.  She has never had prolonged heart racing.  Past Medical History:  Diagnosis Date  . Anxiety   . Arterial thrombosis ([place])   . Colon cancer screening   . Depressive disorder   . Estrogen deficiency   . Heart murmur    mitral valve prolapse  . HSV-2 (herpes simplex virus 2) infection 2006  . Irregular heartbeat   . Myxoid liposarcoma   . 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 Osteoporosis 01/2008   -3.7  . 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 arterial vascular grafting    . AUGMENTATION MAMMAPLASTY    . BREAST SURGERY     breast implants  . mass removal sacral area    . Radiation for liposarcoma    . removal liposarcoma    . VAGINAL HYSTERECTOMY  1995    Current Medications: Current Meds  Medication Sig   . MAGNESIUM-ZINC PO Take by mouth.  . OIL OF OREGANO PO Take by mouth.  . TURMERIC PO Take by mouth.  . Vitamin D, Ergocalciferol, (DRISDOL) 1.25 MG (50000 UNIT) CAPS capsule Take 50,000 Units by mouth every 7 (seven) days.     Allergies:   Patient has no known allergies.   Social History   Socioeconomic History  . Marital status: Divorced    Spouse name: Not on file  . Number of children: Not on file  . Years of education: Not on file  . Highest education level: Not on file  Occupational History  . Not on file  Tobacco Use  . Smoking status: Never Smoker  . Smokeless tobacco: Never Used  Vaping Use  . Vaping Use: Never used  Substance and Sexual Activity  . Alcohol use: Yes    Alcohol/week: 7.0 standard drinks    Types: 7 Glasses of wine per week  . Drug use: No  . Sexual activity: Not Currently    Partners: Male    Birth control/protection: Surgical    Comment: hyst-1st intercourse 70 yo-More than 5 partners  Other Topics Concern  . Not on file  Social History Narrative  . Not on file   Social Determinants of Health   Financial Resource Strain: Not on file  Food Insecurity: Not on file  Transportation Needs: Not on file  Physical Activity: Not on file  Stress: Not on file  Social Connections: Not on file     Family History: The patient's family history includes Cancer in her sister.  ROS:   Please see the history of present illness.    She had history of resection of a liposarcoma of her right thigh.  She had vascular reconstruction with a Gore-Tex arterial graft.  He eventually had thrombosis of the conduit and presumed natural collateralization.  She took herself off Coumadin after the graft closed.  Prior cardiac work-up did not reveal any underlying problem.  She is not vaccinated.  She is naturopathic.  She takes multiple natural substances from [facility].  She does not smoke.  She has a least 1 glass of wine every night.  No family history of heart disease.   All other systems reviewed and are negative.  EKGs/Labs/Other Studies Reviewed:    The following studies were reviewed today: No cardiac imaging  EKG:  EKG sinus rhythm, biatrial abnormality, isolated PVC.  Recent Labs: No results found for requested labs within last 8760 hours.  Recent Lipid Panel    Component Value Date/Time   CHOL 191 03/02/2012 1635   TRIG 58 03/02/2012 1635   HDL 56 03/02/2012 1635   CHOLHDL 3.4 03/02/2012 1635   VLDL 12 03/02/2012 1635   LDLCALC 123 (H) 03/02/2012 1635    Physical Exam:    VS:  BP (!) 142/78   Pulse 72   Ht 5\' 8"  (1.727 m)   Wt 121 lb (54.9 kg)   LMP 02/24/1991   SpO2 98%   BMI 18.40 kg/m     Wt Readings from Last 3 Encounters:  03/29/20 121 lb (54.9 kg)  04/11/19 121 lb (54.9 kg)  04/06/18 122 lb (55.3 kg)     GEN: Slender with BMI 18. No acute distress HEENT: Normal NECK: No JVD. LYMPHATICS: No lymphadenopathy CARDIAC: No murmur. RRR no gallop, or edema. VASCULAR:  Normal Pulses. No bruits. RESPIRATORY:  Clear to auscultation without rales, wheezing or rhonchi  ABDOMEN: Soft, non-tender, non-distended, No pulsatile mass, MUSCULOSKELETAL: No deformity  SKIN: Warm and dry NEUROLOGIC:  Alert and oriented x 3 PSYCHIATRIC:  Normal affect   ASSESSMENT:    1. Palpitations   2. Vascular graft thrombosis, subsequent encounter   3. Chronic anticoagulation   4. Educated about COVID-19 virus infection   5. PVC (premature ventricular contraction)    PLAN:    In order of problems listed above:  1. PVC's is a likely source of the patient's palpitations.  A 72-hour monitor will be done to quantitate PVC burden and rule out of arrhythmia since her symptoms occur every day.  A 2D Doppler echocardiogram also be performed. 2. She had reconstruction of her right thigh with Gore-Tex graft placement after resection of her femoral artery in block with CV tumor (Liposarcoma).  Eventually noted to have an occluded graft and the patient  discontinued Coumadin therapy-not sure if this occurred before after graft occlusion 3. Not anticoagulated 4. Not vaccinated and looking for naturopathic remedies.  Wanted to know if I will prescribe ivermectin and or a Z-Pak in case she gets Covid.  We did not have significant conversation concerning this.  72-hour monitor and 2D Doppler echocardiogram have been ordered.   Medication Adjustments/Labs and Tests Ordered: Current medicines are reviewed at length with the patient today.  Concerns regarding medicines are outlined above.  Orders  Placed This Encounter  Procedures  . LONG TERM MONITOR (3-14 DAYS)  . EKG 12-Lead  . ECHOCARDIOGRAM COMPLETE   No orders of the defined types were placed in this encounter.   Patient Instructions  Medication Instructions:  Your physician recommends that you continue on your current medications as directed. Please refer to the Current Medication list given to you today.  *If you need a refill on your cardiac medications before your next appointment, please call your pharmacy*   Lab Work: None If you have labs (blood work) drawn today and your tests are completely normal, you will receive your results only by: Marland Kitchen MyChart Message (if you have MyChart) OR . A paper copy in the mail If you have any lab test that is abnormal or we need to change your treatment, we will call you to review the results.   Testing/Procedures: Your physician has requested that you have an echocardiogram. Echocardiography is a painless test that uses sound waves to create images of your heart. It provides your doctor with information about the size and shape of your heart and how well your heart's chambers and valves are working. This procedure takes approximately one hour. There are no restrictions for this procedure.  Your physician recommends that you wear a monitor for 3 days.   Follow-Up: At [facility], you and your health needs are our priority.  As part of  our continuing mission to provide you with exceptional heart care, we have created designated Provider Care Teams.  These Care Teams include your primary Cardiologist (physician) and Advanced Practice Providers (APPs -  Physician Assistants and Nurse Practitioners) who all work together to provide you with the care you need, when you need it.  We recommend signing up for the patient portal called "MyChart".  Sign up information is provided on this After Visit Summary.  MyChart is used to connect with patients for Virtual Visits (Telemedicine).  Patients are able to view lab/test results, encounter notes, upcoming appointments, etc.  Non-urgent messages can be sent to your provider as well.   To learn more about what you can do with MyChart, go to [email or web address].    Your next appointment:   As needed  The format for your next appointment:   In Person  Provider:   You may see Dr. Daneen Schick or one of the following Advanced Practice Providers on your designated Care Team:    Truitt Merle, NP  Cecilie Kicks, NP  Kathyrn Drown, NP    Other Instructions  [place] Monitor Instructions   Your physician has requested you wear your ZIO patch monitor 3 days.   This is a single patch monitor.  [employer] supplies one patch monitor per enrollment.  Additional stickers are not available.   Please do not apply patch if you will be having a Nuclear Stress Test, Echocardiogram, Cardiac CT, MRI, or Chest Xray during the time frame you would be wearing the monitor. The patch cannot be worn during these tests.  You cannot remove and re-apply the ZIO XT patch monitor.   Your ZIO patch monitor will be sent USPS Priority mail from [facility] directly to your home address. The monitor may also be mailed to a PO BOX if home delivery is not available.   It may take 3-5 days to receive your monitor after you have been enrolled.   Once you have received you monitor, please review  enclosed instructions.  Your monitor has already been registered assigning  a specific monitor serial # to you.   Applying the monitor   Shave hair from upper left chest.   Hold abrader disc by orange tab.  Rub abrader in 40 strokes over left upper chest as indicated in your monitor instructions.   Clean area with 4 enclosed alcohol pads .  Use all pads to assure are is cleaned thoroughly.  Let dry.   Apply patch as indicated in monitor instructions.  Patch will be place under collarbone on left side of chest with arrow pointing upward.   Rub patch adhesive wings for 2 minutes.Remove white label marked "1".  Remove white label marked "2".  Rub patch adhesive wings for 2 additional minutes.   While looking in a mirror, press and release button in center of patch.  A small green light will flash 3-4 times .  This will be your only indicator the monitor has been turned on.     Do not shower for the first 24 hours.  You may shower after the first 24 hours.   Press button if you feel a symptom. You will hear a small click.  Record Date, Time and Symptom in the Patient Log Book.   When you are ready to remove patch, follow instructions on last 2 pages of Patient Log Book.  Stick patch monitor onto last page of Patient Log Book.   Place Patient Log Book in [place] box.  Use locking tab on box and tape box closed securely.  The [employer] has [employer] on it.  Please place in mailbox as soon as possible.  Your physician should have your test results approximately 7 days after the monitor has been mailed back to [facility].   Call [place] at [phone] if you have questions regarding your ZIO XT patch monitor.  Call them immediately if you see an orange light blinking on your monitor.   If your monitor falls off in less than 4 days contact our Monitor department at [phone].  If your monitor becomes loose or falls off after 4 days call [employer] at  [phone] for suggestions on securing your monitor.       Signed, Sinclair Grooms, MD  03/29/2020 3:11 PM    [place]

## 2020-03-29 ENCOUNTER — Ambulatory Visit (INDEPENDENT_AMBULATORY_CARE_PROVIDER_SITE_OTHER): Payer: Medicare HMO

## 2020-03-29 ENCOUNTER — Encounter: Payer: Self-pay | Admitting: Radiology

## 2020-03-29 ENCOUNTER — Other Ambulatory Visit: Payer: Self-pay

## 2020-03-29 ENCOUNTER — Ambulatory Visit: Payer: Medicare HMO | Admitting: Interventional Cardiology

## 2020-03-29 ENCOUNTER — Encounter: Payer: Self-pay | Admitting: Interventional Cardiology

## 2020-03-29 VITALS — BP 142/78 | HR 72 | Ht 68.0 in | Wt 121.0 lb

## 2020-03-29 DIAGNOSIS — Z7901 Long term (current) use of anticoagulants: Secondary | ICD-10-CM

## 2020-03-29 DIAGNOSIS — Z7189 Other specified counseling: Secondary | ICD-10-CM | POA: Diagnosis not present

## 2020-03-29 DIAGNOSIS — T82868D Thrombosis of vascular prosthetic devices, implants and grafts, subsequent encounter: Secondary | ICD-10-CM | POA: Diagnosis not present

## 2020-03-29 DIAGNOSIS — R002 Palpitations: Secondary | ICD-10-CM | POA: Diagnosis not present

## 2020-03-29 DIAGNOSIS — I493 Ventricular premature depolarization: Secondary | ICD-10-CM

## 2020-03-29 DIAGNOSIS — Z0100 Encounter for examination of eyes and vision without abnormal findings: Secondary | ICD-10-CM | POA: Diagnosis not present

## 2020-03-29 NOTE — Patient Instructions (Signed)
Medication Instructions:  Your physician recommends that you continue on your current medications as directed. Please refer to the Current Medication list given to you today.  *If you need a refill on your cardiac medications before your next appointment, please call your pharmacy*   Lab Work: None If you have labs (blood work) drawn today and your tests are completely normal, you will receive your results only by: Marland Kitchen MyChart Message (if you have MyChart) OR . A paper copy in the mail If you have any lab test that is abnormal or we need to change your treatment, we will call you to review the results.   Testing/Procedures: Your physician has requested that you have an echocardiogram. Echocardiography is a painless test that uses sound waves to create images of your heart. It provides your doctor with information about the size and shape of your heart and how well your heart's chambers and valves are working. This procedure takes approximately one hour. There are no restrictions for this procedure.  Your physician recommends that you wear a monitor for 3 days.   Follow-Up: At [facility], you and your health needs are our priority.  As part of our continuing mission to provide you with exceptional heart care, we have created designated Provider Care Teams.  These Care Teams include your primary Cardiologist (physician) and Advanced Practice Providers (APPs -  Physician Assistants and Nurse Practitioners) who all work together to provide you with the care you need, when you need it.  We recommend signing up for the patient portal called "MyChart".  Sign up information is provided on this After Visit Summary.  MyChart is used to connect with patients for Virtual Visits (Telemedicine).  Patients are able to view lab/test results, encounter notes, upcoming appointments, etc.  Non-urgent messages can be sent to your provider as well.   To learn more about what you can do with MyChart, go to  [email or web address].    Your next appointment:   As needed  The format for your next appointment:   In Person  Provider:   You may see Dr. Daneen Schick or one of the following Advanced Practice Providers on your designated Care Team:    Truitt Merle, NP  Cecilie Kicks, NP  Kathyrn Drown, NP    Other Instructions  [place] Monitor Instructions   Your physician has requested you wear your ZIO patch monitor 3 days.   This is a single patch monitor.  [employer] supplies one patch monitor per enrollment.  Additional stickers are not available.   Please do not apply patch if you will be having a Nuclear Stress Test, Echocardiogram, Cardiac CT, MRI, or Chest Xray during the time frame you would be wearing the monitor. The patch cannot be worn during these tests.  You cannot remove and re-apply the ZIO XT patch monitor.   Your ZIO patch monitor will be sent USPS Priority mail from [facility] directly to your home address. The monitor may also be mailed to a PO BOX if home delivery is not available.   It may take 3-5 days to receive your monitor after you have been enrolled.   Once you have received you monitor, please review enclosed instructions.  Your monitor has already been registered assigning a specific monitor serial # to you.   Applying the monitor   Shave hair from upper left chest.   Hold abrader disc by orange tab.  Rub abrader in 40 strokes over left upper chest as  indicated in your monitor instructions.   Clean area with 4 enclosed alcohol pads .  Use all pads to assure are is cleaned thoroughly.  Let dry.   Apply patch as indicated in monitor instructions.  Patch will be place under collarbone on left side of chest with arrow pointing upward.   Rub patch adhesive wings for 2 minutes.Remove white label marked "1".  Remove white label marked "2".  Rub patch adhesive wings for 2 additional minutes.   While looking in a mirror, press and release  button in center of patch.  A small green light will flash 3-4 times .  This will be your only indicator the monitor has been turned on.     Do not shower for the first 24 hours.  You may shower after the first 24 hours.   Press button if you feel a symptom. You will hear a small click.  Record Date, Time and Symptom in the Patient Log Book.   When you are ready to remove patch, follow instructions on last 2 pages of Patient Log Book.  Stick patch monitor onto last page of Patient Log Book.   Place Patient Log Book in [place] box.  Use locking tab on box and tape box closed securely.  The [employer] has [employer] on it.  Please place in mailbox as soon as possible.  Your physician should have your test results approximately 7 days after the monitor has been mailed back to [facility].   Call [place] at [phone] if you have questions regarding your ZIO XT patch monitor.  Call them immediately if you see an orange light blinking on your monitor.   If your monitor falls off in less than 4 days contact our Monitor department at [phone].  If your monitor becomes loose or falls off after 4 days call [employer] at [phone] for suggestions on securing your monitor.

## 2020-03-29 NOTE — Progress Notes (Signed)
Enrolled patient for a 3 day Zio XT Monitor to be mailed to patients home.  °

## 2020-04-01 ENCOUNTER — Telehealth: Payer: Self-pay | Admitting: Interventional Cardiology

## 2020-04-01 NOTE — Telephone Encounter (Signed)
Patient would like to know if echocardiogram scheduled for 04/18/20 requires an authorization from her insurance. She states she contacted [employer] and she was informed that they need a procedure code in order to process any authorization requests and answer questions. I contacted billing and obtained procedure code, [place]. I was then advised to contact Allyson B. to find out if the echo requires a pre-authorization. Unable to reach her, I made patient aware that we will call her back when we know if this test requires an authorization.

## 2020-04-01 NOTE — Telephone Encounter (Signed)
Routing message to prior auth pool.

## 2020-04-02 DIAGNOSIS — I493 Ventricular premature depolarization: Secondary | ICD-10-CM

## 2020-04-02 DIAGNOSIS — R002 Palpitations: Secondary | ICD-10-CM | POA: Diagnosis not present

## 2020-04-09 DIAGNOSIS — Z7189 Other specified counseling: Secondary | ICD-10-CM | POA: Diagnosis not present

## 2020-04-09 DIAGNOSIS — I493 Ventricular premature depolarization: Secondary | ICD-10-CM | POA: Diagnosis not present

## 2020-04-09 DIAGNOSIS — R030 Elevated blood-pressure reading, without diagnosis of hypertension: Secondary | ICD-10-CM | POA: Diagnosis not present

## 2020-04-09 DIAGNOSIS — E7800 Pure hypercholesterolemia, unspecified: Secondary | ICD-10-CM | POA: Diagnosis not present

## 2020-04-09 DIAGNOSIS — R7309 Other abnormal glucose: Secondary | ICD-10-CM | POA: Diagnosis not present

## 2020-04-16 ENCOUNTER — Encounter: Payer: Medicare HMO | Admitting: Nurse Practitioner

## 2020-04-18 ENCOUNTER — Ambulatory Visit (HOSPITAL_COMMUNITY): Payer: Medicare HMO | Attending: Internal Medicine

## 2020-04-18 ENCOUNTER — Other Ambulatory Visit: Payer: Self-pay

## 2020-04-18 DIAGNOSIS — I493 Ventricular premature depolarization: Secondary | ICD-10-CM | POA: Insufficient documentation

## 2020-04-18 DIAGNOSIS — R002 Palpitations: Secondary | ICD-10-CM | POA: Insufficient documentation

## 2020-04-18 DIAGNOSIS — I080 Rheumatic disorders of both mitral and aortic valves: Secondary | ICD-10-CM | POA: Insufficient documentation

## 2020-04-19 ENCOUNTER — Telehealth: Payer: Self-pay | Admitting: *Deleted

## 2020-04-19 DIAGNOSIS — I493 Ventricular premature depolarization: Secondary | ICD-10-CM | POA: Diagnosis not present

## 2020-04-19 DIAGNOSIS — R002 Palpitations: Secondary | ICD-10-CM | POA: Diagnosis not present

## 2020-04-19 LAB — ECHOCARDIOGRAM COMPLETE
Area-P 1/2: 2.73 cm2
S' Lateral: 3 cm

## 2020-04-19 NOTE — Telephone Encounter (Signed)
Patient here yesterday for echo and was inquiring about her monitor. She mailed back on 04/05/20.  Discussed with Darrick Penna and she was able to see that the monitor has been received and is in process.  I tried to let the patient know before she left the office yesterday but she had already gone.  So I called her this morning to let her know the status.  While on the phone she was asking about the results of her echo. Let her know that it had been read and looked okay but that Dr. Tamala Julian had not reviewed yet.  Once he reviews and gets the monitor results back that she will be contacted as to his recommendations. She was appreciative of my call and is just anxious as to "why" her heart is skipping.

## 2020-05-01 ENCOUNTER — Encounter: Payer: Self-pay | Admitting: Obstetrics and Gynecology

## 2020-05-01 ENCOUNTER — Other Ambulatory Visit: Payer: Self-pay

## 2020-05-01 ENCOUNTER — Ambulatory Visit: Payer: Medicare HMO | Admitting: Obstetrics and Gynecology

## 2020-05-01 VITALS — BP 134/68 | HR 70 | Ht 65.5 in | Wt 121.0 lb

## 2020-05-01 DIAGNOSIS — N6310 Unspecified lump in the right breast, unspecified quadrant: Secondary | ICD-10-CM

## 2020-05-01 DIAGNOSIS — Z1239 Encounter for other screening for malignant neoplasm of breast: Secondary | ICD-10-CM

## 2020-05-01 DIAGNOSIS — M810 Age-related osteoporosis without current pathological fracture: Secondary | ICD-10-CM | POA: Diagnosis not present

## 2020-05-01 DIAGNOSIS — B009 Herpesviral infection, unspecified: Secondary | ICD-10-CM

## 2020-05-01 DIAGNOSIS — Z01419 Encounter for gynecological examination (general) (routine) without abnormal findings: Secondary | ICD-10-CM | POA: Diagnosis not present

## 2020-05-01 MED ORDER — VALACYCLOVIR HCL 500 MG PO TABS: 500.0000 mg | ORAL_TABLET | Freq: Two times a day (BID) | ORAL | 1 refills | Status: DC

## 2020-05-01 NOTE — Progress Notes (Signed)
GYNECOLOGY  VISIT   HPI: 70 y.o.   Divorced  Caucasian  female   [phone] with Patient's last menstrual period was 02/24/1991.   here for breast and pelvic exam.  She is followed for HSV and Valtrex usage. She has osteoporosis also.  HSV outbreak rarely.   PCP did labs last week.  Has 2 sons and 2 grand daughters.   Labs done with PCP.  Vit D was normal.   PCP - Dr. Lindell Noe  GYNECOLOGIC HISTORY: Patient's last menstrual period was 02/24/1991. Contraception: Hyst Menopausal hormone therapy:  n/a Last mammogram:  12-20-18 Neg/BiRads2.  2021 - normal per patient at [facility].  Last pap smear: 04-25-13 Neg, 03-26-10 Neg        OB History    Gravida  3   Para  2   Term  2   Preterm      AB  1   Living  2     SAB  1   IAB      Ectopic      Multiple      Live Births                 Patient Active Problem List   Diagnosis Date Noted  . 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 Arterial thrombosis ([place])   . Heart murmur   . Osteoporosis   . HSV-2 (herpes simplex virus 2) infection   . Chronic anticoagulation 01/27/2011  . 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 Anxiety   . Arterial thrombosis ([place])   . Colon cancer screening   . Depressive disorder   . Estrogen deficiency   . Heart murmur    mitral valve prolapse  . HSV-2 (herpes simplex virus 2) infection 2006  . Irregular heartbeat   . Myxoid liposarcoma   . 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 Osteoporosis 01/2008   -3.7  . 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 arterial vascular grafting    . AUGMENTATION MAMMAPLASTY    . BREAST SURGERY     breast implants  . mass removal sacral area    . Radiation for liposarcoma    . removal liposarcoma    . VAGINAL HYSTERECTOMY  1995    Current Outpatient Medications  Medication Sig Dispense Refill  . Ascorbic Acid (VITAMIN C) 1000 MG tablet Take 1,000 mg by  mouth daily.    . Cholecalciferol (VITAMIN D3) 50 MCG (2000 UT) CAPS Take 1 capsule by mouth daily.    . OIL OF OREGANO PO Take by mouth.    Marland Kitchen OVER THE COUNTER MEDICATION Quercitin--2 tablets daily    . TURMERIC PO Take by mouth.    . zinc gluconate 50 MG tablet Take 50 mg by mouth daily.     No current facility-administered medications for this visit.     ALLERGIES: Patient has no known allergies.  Family History  Problem Relation Age of Onset  . Cancer Sister        non-hodgkins lymphoma    Social History   Socioeconomic History  . Marital status: Divorced    Spouse name: Not on file  . Number of children: Not on file  . Years of education: Not on file  . Highest education level: Not on file  Occupational History  . Not on file  Tobacco Use  . Smoking status: Never Smoker  . Smokeless  tobacco: Never Used  Vaping Use  . Vaping Use: Never used  Substance and Sexual Activity  . Alcohol use: Yes    Alcohol/week: 7.0 standard drinks    Types: 7 Glasses of wine per week  . Drug use: No  . Sexual activity: Not Currently    Partners: Male    Birth control/protection: Surgical    Comment: hyst-1st intercourse 70 yo-More than 5 partners  Other Topics Concern  . Not on file  Social History Narrative  . Not on file   Social Determinants of Health   Financial Resource Strain: Not on file  Food Insecurity: Not on file  Transportation Needs: Not on file  Physical Activity: Not on file  Stress: Not on file  Social Connections: Not on file  Intimate Partner Violence: Not on file    Review of Systems  Allergic/Immunologic: Positive for environmental allergies.  All other systems reviewed and are negative.   PHYSICAL EXAMINATION:    Ht 5' 5.5" (1.664 m)   Wt 121 lb (54.9 kg)   LMP 02/24/1991   BMI 19.83 kg/m     General appearance: alert, cooperative and appears stated age Head: Normocephalic, without obvious abnormality, atraumatic Lungs: clear to auscultation  bilaterally Breasts: bilateral implants, right breast - normal appearance, 1 cm mass inferior breast, nontender, No nipple retraction or dimpling, No nipple discharge or bleeding, No axillary adenopathy Left breast - normal appearance, no mass or tenderness, No nipple retraction or dimpling, No nipple discharge or bleeding, No axillary adenopathy Heart: regular rate and rhythm Abdomen: soft, non-tender, no masses,  no organomegaly Extremities: extremities normal, atraumatic, no cyanosis or edema Skin: Skin color, texture, turgor normal. No rashes or lesions No abnormal inguinal nodes palpated Neurologic: Grossly normal  Pelvic: External genitalia:  no lesions              Urethra:  normal appearing urethra with no masses, tenderness or lesions              Bartholins and Skenes: normal                 Vagina: normal appearing vagina with normal color and discharge, no lesions              Cervix: absent                Bimanual Exam:  Uterus:  absent              Adnexa: no mass, fullness, tenderness              Rectal exam: Yes.  .  Confirms.              Anus:  normal sphincter tone, no lesions  Chaperone was present for exam.  ASSESSMENT  Status post TVH. Ovaries remain.  Hx HSV 2.  Hx arterial thrombosis x 3, once in a native artery and then twice in a Gortex graft.  Hx liposarcoma of left thigh.  Status post resection and radiation therapy.  Hx breast augmentation.  Right nipple mass at inferior breast 1 cm. Osteoporosis, not treated with prescription medication.   PLAN     Refill of Valtrex. Dx right breast mammogram and ultrasound.  Will get copy of her mammogram from 2021 at [facility]. Daily calcium and vit D requirements reviewed.  Weight bearing exercise recommended.  Declines BMD.  32 min total time was spent for this patient encounter, including preparation, face-to-face counseling with the patient, coordination of care,  and documentation of the encounter.

## 2020-05-05 ENCOUNTER — Telehealth: Payer: Self-pay | Admitting: Obstetrics and Gynecology

## 2020-05-05 NOTE — Telephone Encounter (Signed)
Please schedule dx right mammogram and right breast/axillary ultrasound at [facility].   Patient has a 1 cm right inferior nipple mass.   I am not certain of the date of her last mammogram at [facility], so I do not know if she is due to have her left side imaged as well.   I need a copy of her mammogram from 2021 to have scanned in to [facility].

## 2020-05-06 ENCOUNTER — Encounter: Payer: Self-pay | Admitting: *Deleted

## 2020-05-06 NOTE — Telephone Encounter (Addendum)
[facility] will fax copy of last mammogram in 01/2020. Patient scheduled on 05/21/20 @ 11:15am  order will be faxed once Dr.Silva signs order.   Left message for patient to call.

## 2020-05-06 NOTE — Telephone Encounter (Signed)
Patient informed with time and date.  

## 2020-05-06 NOTE — Telephone Encounter (Signed)
This encounter was created in error - please disregard.

## 2020-05-06 NOTE — Telephone Encounter (Signed)
Spoke with pt and made her aware of results and recommendations.  Pt verbalized understanding and was in agreement with plan.

## 2020-05-09 DIAGNOSIS — H5213 Myopia, bilateral: Secondary | ICD-10-CM | POA: Diagnosis not present

## 2020-05-09 DIAGNOSIS — H2513 Age-related nuclear cataract, bilateral: Secondary | ICD-10-CM | POA: Diagnosis not present

## 2020-05-09 DIAGNOSIS — H524 Presbyopia: Secondary | ICD-10-CM | POA: Diagnosis not present

## 2020-05-09 DIAGNOSIS — Z0100 Encounter for examination of eyes and vision without abnormal findings: Secondary | ICD-10-CM | POA: Diagnosis not present

## 2020-05-20 ENCOUNTER — Encounter: Payer: Self-pay | Admitting: Internal Medicine

## 2020-05-20 ENCOUNTER — Other Ambulatory Visit: Payer: Self-pay

## 2020-05-20 ENCOUNTER — Ambulatory Visit: Payer: Medicare HMO | Admitting: Internal Medicine

## 2020-05-20 DIAGNOSIS — I493 Ventricular premature depolarization: Secondary | ICD-10-CM

## 2020-05-20 DIAGNOSIS — R69 Illness, unspecified: Secondary | ICD-10-CM | POA: Diagnosis not present

## 2020-05-20 MED ORDER — METOPROLOL SUCCINATE ER 25 MG PO TB24: 25.0000 mg | ORAL_TABLET | Freq: Every day | ORAL | 3 refills | Status: DC

## 2020-05-20 NOTE — Progress Notes (Signed)
HPI Natalie Reyes is referred today by Dr. Tamala Julian for evaluation of palpitations. She is a pleasant but anxious 70 yo woman with a h/o lymphoid myosarcoma with resection. She has had palpitations for years but worsened in the past few months. She notes life stresses. She has not had syncope and has preserved LV function. She had 15% PVC's by cardiac monitor. No VT. She has had ECG's which demonstrate a LBBB PVC morphology which transitions from negative to positive at lead V4. Axis in inferior. She denies ETOH or caffeine excess and only sleeps 4 hours a night.  No Known Allergies   Current Outpatient Medications  Medication Sig Dispense Refill  . Ascorbic Acid (VITAMIN C) 1000 MG tablet Take 1,000 mg by mouth daily.    . Cholecalciferol (VITAMIN D3) 50 MCG (2000 UT) CAPS Take 1 capsule by mouth daily.    . metoprolol succinate (TOPROL XL) 25 MG 24 hr tablet Take 1 tablet (25 mg total) by mouth daily. Start taking 1/2 tablet by mouth daily.  After 2 weeks increase to one tablet by mouth daily. 90 tablet 3  . OIL OF OREGANO PO Take by mouth.    Marland Kitchen OVER THE COUNTER MEDICATION Quercitin--2 tablets daily    . TURMERIC PO Take by mouth.    . valACYclovir (VALTREX) 500 MG tablet Take 1 tablet (500 mg total) by mouth 2 (two) times daily. Take one tablet BID at onset of symptoms for 3 days. 30 tablet 1  . zinc gluconate 50 MG tablet Take 50 mg by mouth daily.     No current facility-administered medications for this visit.     Past Medical History:  Diagnosis Date  . Anxiety   . Arterial thrombosis ([place])   . Colon cancer screening   . Depressive disorder   . Estrogen deficiency   . Heart murmur    mitral valve prolapse  . HSV-2 (herpes simplex virus 2) infection 2006  . Irregular heartbeat   . Myxoid liposarcoma   . 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 Osteoporosis 01/2008   -3.7  . Other specified coagulation defects ([place])     ROS:   All systems reviewed and negative except as noted in the  HPI.   Past Surgical History:  Procedure Laterality Date  . arterial vascular grafting    . AUGMENTATION MAMMAPLASTY    . BREAST SURGERY     breast implants  . mass removal sacral area    . Radiation for liposarcoma    . removal liposarcoma    . VAGINAL HYSTERECTOMY  1995     Family History  Problem Relation Age of Onset  . Cancer Sister        non-hodgkins lymphoma     Social History   Socioeconomic History  . Marital status: Divorced    Spouse name: Not on file  . Number of children: Not on file  . Years of education: Not on file  . Highest education level: Not on file  Occupational History  . Not on file  Tobacco Use  . Smoking status: Never Smoker  . Smokeless tobacco: Never Used  Vaping Use  . Vaping Use: Never used  Substance and Sexual Activity  . Alcohol use: Yes    Alcohol/week: 7.0 standard drinks    Types: 7 Glasses of wine per week  . Drug use: No  . Sexual activity: Not Currently    Partners: Male    Birth control/protection: Surgical  Comment: hyst-1st intercourse 70 yo-More than 5 partners  Other Topics Concern  . Not on file  Social History Narrative  . Not on file   Social Determinants of Health   Financial Resource Strain: Not on file  Food Insecurity: Not on file  Transportation Needs: Not on file  Physical Activity: Not on file  Stress: Not on file  Social Connections: Not on file  Intimate Partner Violence: Not on file     BP 136/80 (BP Location: Left Arm, Patient Position: Sitting, Cuff Size: Normal)   Pulse 68   Ht 5' 5.5" (1.664 m)   LMP 02/24/1991   SpO2 98%   BMI 19.83 kg/m   Physical Exam:  Well appearing NAD HEENT: Unremarkable Neck:  No JVD, no thyromegally Lymphatics:  No adenopathy Back:  No CVA tenderness Lungs:  Clear with no wheezes HEART:  Regular rate rhythm, no murmurs, no rubs, no clicks Abd:  soft, positive bowel sounds, no organomegally, no rebound, no guarding Ext:  2 plus pulses, no edema, no  cyanosis, no clubbing Skin:  No rashes no nodules Neuro:  CN II through XII intact, motor grossly intact  EKG - nsr with PVC's  Assess/Plan: 1. PVC's - I discussed the treatment options with the patient. I recommended she stop caffeine and ETOH and try to get 6 or more hours of sleep. We discussed medical therapy and I recommended she take toprol 25 mg daily. We also discussed flecainide and catheter ablation. I'll see her back in several months.  Carleene Overlie Taylor,MD

## 2020-05-20 NOTE — Patient Instructions (Addendum)
Medication Instructions:  Your physician has recommended you make the following change in your medication:   1.  START taking Toprol XL (metoprolol succinate) 25 mg-  Take 1/2 tablet by mouth daily for 2 weeks.  Then increase to 1 tablet by mouth daily.  Labwork: None ordered.  Testing/Procedures: None ordered.  Follow-Up: Your physician wants you to follow-up in: 3 months with Natalie Peru, MD   Any Other Special Instructions Will Be Listed Below (If Applicable).  If you need a refill on your cardiac medications before your next appointment, please call your pharmacy.   Metoprolol Extended-Release Capsules What is this medicine? METOPROLOL (me TOE proe lole) is a beta blocker. It decreases the amount of work your heart has to do and helps your heart beat regularly. It treats high blood pressure and/or prevents chest pain (also called angina). It also treats heart failure. This medicine may be used for other purposes; ask your health care provider or pharmacist if you have questions. COMMON BRAND NAME(S): KAPSPARGO What should I tell my health care provider before I take this medicine? They need to know if you have any of these conditions:  diabetes  heart disease  liver disease  lung or breathing disease, like asthma  pheochromocytoma  thyroid disease  an unusual or allergic reaction to metoprolol, other beta-blockers, medicines, foods, dyes, or preservatives  pregnant or trying to get pregnant  breast-feeding How should I use this medicine? Take this drug by mouth with water. Take it as directed on the prescription label at the same time every day. Do not cut, crush or chew this drug. Swallow the capsules whole. You may open the capsule and put the contents in 1 teaspoon of applesauce. Swallow the drug and applesauce right away. Do not chew the drug or applesauce. Keep taking it unless your health care provider tells you to stop. Talk to your health care provider about  the use of this drug in children. While it may be prescribed for children as young as 6 for selected conditions, precautions do apply. Overdosage: If you think you have taken too much of this medicine contact a poison control center or emergency room at once. NOTE: This medicine is only for you. Do not share this medicine with others. What if I miss a dose? If you miss a dose, take it as soon as you can. If it is almost time for your next dose, take only that dose. Do not take double or extra doses. What may interact with this medicine? This medicine may interact with the following medications:  certain medicines for blood pressure, heart disease, irregular heart beat  epinephrine  fluoxetine  MAOIs like Carbex, Eldepryl, Marplan, Nardil, and Parnate  paroxetine  reserpine This list may not describe all possible interactions. Give your health care provider a list of all the medicines, herbs, non-prescription drugs, or dietary supplements you use. Also tell them if you smoke, drink alcohol, or use illegal drugs. Some items may interact with your medicine. What should I watch for while using this medicine? You may get drowsy or dizzy. Do not drive, use machinery, or do anything that needs mental alertness until you know how this medicine affects you. Do not stand or sit up quickly, especially if you are an older patient. This reduces the risk of dizzy or fainting spells. Alcohol may interfere with the effect of this medicine. Avoid alcoholic drinks. Visit your doctor or health care professional for regular checks on your progress. Check your  blood pressure as directed. Ask your doctor or health care professional what your blood pressure should be and when you should contact him or her. Do not treat yourself for coughs, colds, or pain while you are using this medicine without asking your doctor or health care professional for advice. Some ingredients may increase your blood pressure. This  medicine may increase blood sugar. Ask your healthcare provider if changes in diet or medicines are needed if you have diabetes. What side effects may I notice from receiving this medicine? Side effects that you should report to your doctor or health care professional as soon as possible:  allergic reactions like skin rash, itching or hives, swelling of the face, lips, or tongue  cold hands or feet  signs and symptoms of high blood sugar such as being more thirsty or hungry or having to urinate more than normal. You may also feel very tired or have blurry vision.  signs and symptoms of low blood pressure like dizziness; feeling faint or lightheaded, falls; unusually weak or tired  signs of worsening heart failure like breathing problems, swelling in your legs and feet  suicidal thoughts or other mood changes  unusually slow heartbeat Side effects that usually do not require medical attention (report these to your doctor or health care professional if they continue or are bothersome):  anxious  change in sex drive or performance  diarrhea  headache  trouble sleeping  upset stomach This list may not describe all possible side effects. Call your doctor for medical advice about side effects. You may report side effects to FDA at 1-800-FDA-1088. Where should I keep my medicine? Keep out of the reach of children and pets. Store at room temperature between 20 and 25 degrees C (68 and 77 degrees F). Throw away any unused drug after the expiration date. NOTE: This sheet is a summary. It may not cover all possible information. If you have questions about this medicine, talk to your doctor, pharmacist, or health care provider.  2021 Elsevier/Gold Standard (2018-12-07 13:24:03)    Premature Ventricular Contraction  A premature ventricular contraction (PVC) is a common kind of irregular heartbeat (arrhythmia). These contractions are extra heartbeats that start in the ventricles of the  heart and occur too early in the normal sequence. During the PVC, the heart's normal electrical pathway is not used, so the beat is shorter and less effective. In most cases, these contractions come and go and do not require treatment. What are the causes? Common causes of the condition include:  Smoking.  Drinking alcohol.  Certain medicines.  Some illegal drugs.  Stress.  Caffeine. Certain medical conditions can also cause PVCs:  Heart failure.  Heart attack, or coronary artery disease.  Heart valve problems.  Changes in minerals in the blood (electrolytes).  Low blood oxygen levels or high carbon dioxide levels. In many cases, the cause of this condition is not known. What are the signs or symptoms? The main symptom of this condition is fast or skipped heartbeats (palpitations). Other symptoms include:  Chest pain.  Shortness of breath.  Feeling tired.  Dizziness.  Difficulty exercising. In some cases, there are no symptoms. How is this diagnosed? This condition may be diagnosed based on:  Your medical history.  A physical exam. During the exam, the health care provider will check for irregular heartbeats.  Tests, such as: ? An ECG (electrocardiogram) to monitor the electrical activity of your heart. ? An ambulatory cardiac monitor. This device records your heartbeats for  24 hours or more. ? Stress tests to see how exercise affects your heart rhythm and blood supply. ? An echocardiogram. This test uses sound waves (ultrasound) to produce an image of your heart. ? An electrophysiology study (EPS). This test checks for electrical problems in your heart. How is this treated? Treatment for this condition depends on any underlying conditions, the type of PVCs that you are having, and how much the symptoms are interfering with your daily life. Possible treatments include:  Avoiding things that cause premature contractions (triggers). These include caffeine and  alcohol.  Taking medicines if symptoms are severe or if the extra heartbeats are frequent.  Getting treatment for underlying conditions that cause PVCs.  Having an implantable cardioverter defibrillator (ICD), if you are at risk for a serious arrhythmia. The ICD is a small device that is inserted into your chest to monitor your heartbeat. When it senses an irregular heartbeat, it sends a shock to bring the heartbeat back to normal.  Having a procedure to destroy the portion of the heart tissue that sends out abnormal signals (catheter ablation). In some cases, no treatment is required. Follow these instructions at home: Lifestyle  Do not use any products that contain nicotine or tobacco, such as cigarettes, e-cigarettes, and chewing tobacco. If you need help quitting, ask your health care provider.  Do not use illegal drugs.  Exercise regularly. Ask your health care provider what type of exercise is safe for you.  Try to get at least 7-9 hours of sleep each night, or as much as recommended by your health care provider.  Find healthy ways to manage stress. Avoid stressful situations when possible. Alcohol use  Do not drink alcohol if: ? Your health care provider tells you not to drink. ? You are pregnant, may be pregnant, or are planning to become pregnant. ? Alcohol triggers your episodes.  If you drink alcohol: ? Limit how much you use to:  0-1 drink a day for women.  0-2 drinks a day for men.  Be aware of how much alcohol is in your drink. In the [place] one drink equals one 12 oz bottle of beer (355 mL), one 5 oz glass of wine (148 mL), or one 1 oz glass of hard liquor (44 mL). General instructions  Take over-the-counter and prescription medicines only as told by your health care provider.  If caffeine triggers episodes of PVC, do not eat, drink, or use anything with caffeine in it.  Keep all follow-up visits as told by your health care provider. This is  important. Contact a health care provider if you:  Feel palpitations. Get help right away if you:  Have chest pain.  Have shortness of breath.  Have sweating for no reason.  Have nausea and vomiting.  Become light-headed or you faint. Summary  A premature ventricular contraction (PVC) is a common kind of irregular heartbeat (arrhythmia).  In most cases, these contractions come and go and do not require treatment.  You may need to wear an ambulatory cardiac monitor. This records your heartbeats for 24 hours or more.  Treatment depends on any underlying conditions, the type of PVCs that you are having, and how much the symptoms are interfering with your daily life. This information is not intended to replace advice given to you by your health care provider. Make sure you discuss any questions you have with your health care provider. Document Revised: 12/02/2017 Document Reviewed: 12/02/2017 Elsevier Patient Education  2021 [employer].

## 2020-05-22 ENCOUNTER — Telehealth: Payer: Self-pay

## 2020-05-22 NOTE — Telephone Encounter (Signed)
Pt called with c/o R leg pain that she thinks is a blood clot. She has pain and soreness in the area; no redness/warmth/swelling. She has not been seen in office in several years. She has a PCP and I have advised her to call her PCP today to let them know or report to the ED. Pt is very anxious sounding and very hesitant in how she wants to proceed. She is going to call her PCP now.

## 2020-06-18 ENCOUNTER — Encounter: Payer: Self-pay | Admitting: Obstetrics and Gynecology

## 2020-06-18 DIAGNOSIS — N6489 Other specified disorders of breast: Secondary | ICD-10-CM | POA: Diagnosis not present

## 2020-06-18 DIAGNOSIS — R922 Inconclusive mammogram: Secondary | ICD-10-CM | POA: Diagnosis not present

## 2020-07-22 DIAGNOSIS — L821 Other seborrheic keratosis: Secondary | ICD-10-CM | POA: Diagnosis not present

## 2020-07-22 DIAGNOSIS — Z85828 Personal history of other malignant neoplasm of skin: Secondary | ICD-10-CM | POA: Diagnosis not present

## 2020-11-22 ENCOUNTER — Telehealth: Payer: Self-pay

## 2020-11-22 NOTE — Telephone Encounter (Signed)
Received a call stating Tommi Rumps would accept her as a new patient she was referred by Ricardo Jericho informed her message would be sent to schedule appt.

## 2021-01-03 ENCOUNTER — Ambulatory Visit (INDEPENDENT_AMBULATORY_CARE_PROVIDER_SITE_OTHER): Payer: Medicare HMO | Admitting: Adult Health

## 2021-01-03 ENCOUNTER — Encounter: Payer: Self-pay | Admitting: Adult Health

## 2021-01-03 ENCOUNTER — Other Ambulatory Visit: Payer: Self-pay

## 2021-01-03 VITALS — BP 120/72 | HR 66 | Temp 98.4°F | Ht 65.5 in | Wt 122.0 lb

## 2021-01-03 DIAGNOSIS — I493 Ventricular premature depolarization: Secondary | ICD-10-CM

## 2021-01-03 DIAGNOSIS — Z7689 Persons encountering health services in other specified circumstances: Secondary | ICD-10-CM | POA: Diagnosis not present

## 2021-01-03 DIAGNOSIS — Z1211 Encounter for screening for malignant neoplasm of colon: Secondary | ICD-10-CM

## 2021-01-03 DIAGNOSIS — Z8739 Personal history of other diseases of the musculoskeletal system and connective tissue: Secondary | ICD-10-CM | POA: Diagnosis not present

## 2021-01-03 DIAGNOSIS — B009 Herpesviral infection, unspecified: Secondary | ICD-10-CM | POA: Diagnosis not present

## 2021-01-03 NOTE — Progress Notes (Signed)
Patient presents to clinic today to establish care. 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  She is a previous patient at [facility].   Acute Concerns: Establish Care   Chronic Issues:  H/o liposarcoma with resection at age 60.   Palpitations - seen by cardiology once. Prescribed Toprol. Never took it, continues to have palpitations   History of Osteoporosis - last bone density a few years ago.   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      Cholecalciferol (VITAMIN D3) 50 MCG (2000 UT) CAPS Take 1 capsule by mouth daily.     metoprolol succinate (TOPROL XL) 25 MG 24 hr tablet Take 1 tablet (25 mg total) by mouth daily. Start taking 1/2 tablet by mouth daily.  After 2 weeks increase to one tablet by mouth daily. 90 tablet 3   OIL OF OREGANO PO Take by mouth.     OVER THE COUNTER MEDICATION Quercitin--2 tablets daily     TURMERIC PO Take by mouth.     valACYclovir (VALTREX) 500 MG tablet Take 1 tablet (500 mg total) by mouth 2 (two) times daily. Take one tablet BID at onset of symptoms for 3 days. 30 tablet 1   zinc gluconate 50 MG tablet Take 50 mg by mouth daily.     No current facility-administered medications on file prior to visit.    No Known Allergies  Family History  Problem Relation Age of Onset   Cancer Sister        non-hodgkins lymphoma    Social History   Socioeconomic History   Marital status: Divorced    Spouse name: Not on file   Number of children: Not on file   Years of education: Not on file   Highest education level: Not on file  Occupational History   Not on file  Tobacco Use   Smoking  status: Never   Smokeless tobacco: Never  Vaping Use   Vaping Use: Never used  Substance and Sexual Activity   Alcohol use: Yes    Alcohol/week: 7.0 standard drinks    Types: 7 Glasses of wine per week   Drug use: No   Sexual activity: Not Currently    Partners: Male    Birth control/protection: Surgical    Comment: hyst-1st intercourse 70 yo-More than 5 partners  Other Topics Concern   Not on file  Social History Narrative      Social Determinants of Health   Financial Resource Strain: Not on file  Food Insecurity: Not on file  Transportation Needs: Not on file  Physical Activity: Not on file  Stress: Not on file  Social Connections: Not on file  Intimate Partner Violence: Not on file    Review of Systems  Constitutional: Negative.   HENT: Negative.    Eyes: Negative.   Respiratory:  Negative.    Cardiovascular: Negative.   Gastrointestinal: Negative.   Genitourinary: Negative.   Musculoskeletal: Negative.   Skin: Negative.   Neurological: Negative.   Endo/Heme/Allergies: Negative.   Psychiatric/Behavioral: Negative.    All other systems reviewed and are negative.  BP 120/72   Pulse 66   Temp 98.4 F (36.9 C) (Oral)   Ht 5' 5.5" (1.664 m)   Wt 122 lb (55.3 kg)   LMP 02/24/1991   SpO2 98%   BMI 19.99 kg/m   Physical Exam Vitals and nursing note reviewed.  Constitutional:      Appearance: Normal appearance.  Cardiovascular:     Rate and Rhythm: Normal rate and regular rhythm.     Pulses: Normal pulses.     Heart sounds: Normal heart sounds.  Pulmonary:     Effort: Pulmonary effort is normal.     Breath sounds: Normal breath sounds.  Skin:    General: Skin is warm and dry.  Neurological:     General: No focal deficit present.     Mental Status: She is alert and oriented to person, place, and time.  Psychiatric:        Mood and Affect: Mood normal.        Behavior: Behavior normal.        Thought Content: Thought content normal.        Judgment: Judgment normal.     Assessment/Plan: 1. Encounter to establish care - Follow up for CPE  - Continue lifestyle modifications  2. History of osteoporosis  - DG Bone Density; Future  3. Colon cancer screening  - Ambulatory referral to Gastroenterology  4. PVC's (premature ventricular contractions) - Can take Toprol  5. HSV-2 (herpes simplex virus 2) infection - Continue Valtrex PRN   Dorothyann Peng, NP

## 2021-01-30 DIAGNOSIS — Z1231 Encounter for screening mammogram for malignant neoplasm of breast: Secondary | ICD-10-CM | POA: Diagnosis not present

## 2021-01-30 LAB — HM MAMMOGRAPHY

## 2021-02-10 ENCOUNTER — Telehealth: Payer: Self-pay | Admitting: Adult Health

## 2021-02-10 NOTE — Telephone Encounter (Signed)
Left message for patient to call back and schedule Medicare Annual Wellness Visit (AWV) either virtually or in office. Left  my Herbie Drape number [phone]    awvi 02/21/19 per [facility] please schedule at anytime with [facility] Nurse Health Advisor 1 or 2   This should be a 45 minute visit.

## 2021-02-12 ENCOUNTER — Encounter: Payer: Self-pay | Admitting: Adult Health

## 2021-05-13 DIAGNOSIS — H5213 Myopia, bilateral: Secondary | ICD-10-CM | POA: Diagnosis not present

## 2021-05-13 DIAGNOSIS — H2513 Age-related nuclear cataract, bilateral: Secondary | ICD-10-CM | POA: Diagnosis not present

## 2021-05-27 NOTE — Progress Notes (Unsigned)
71 y.o. [id number] Divorced {Race/ethnicity:17218} female here for annual exam.    PCP:     Patient's last menstrual period was 02/24/1991.           Sexually active: {yes no:314532}  The current method of family planning is {contraception:315051}.    Exercising: {yes no:314532}  {types:19826} Smoker:  {YES NO:22349}  Health Maintenance: Pap:  04-25-13 normal History of abnormal Pap:  {YES NO:22349} MMG:  01-30-21 normal Colonoscopy:  2013 BMD:   02-07-08  Result  Osteoporosis TDaP:  *** Gardasil:   no HIV: Hep C: Screening Labs:  Hb today: ***, Urine today: ***   reports that she has never smoked. She has never used smokeless tobacco. She reports current alcohol use of about 7.0 standard drinks per week. She reports that she does not use drugs.  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     Cholecalciferol (VITAMIN D3) 50 MCG (2000 UT) CAPS Take 1 capsule by mouth daily.     metoprolol succinate (TOPROL XL) 25 MG 24 hr tablet Take 1 tablet (25 mg total) by mouth daily. Start taking 1/2 tablet by mouth daily.  After 2 weeks increase to one tablet by mouth daily. 90 tablet 3   OIL OF OREGANO PO Take by mouth.     OVER THE COUNTER  MEDICATION Quercitin--2 tablets daily     TURMERIC PO Take by mouth.     valACYclovir (VALTREX) 500 MG tablet Take 1 tablet (500 mg total) by mouth 2 (two) times daily. Take one tablet BID at onset of symptoms for 3 days. 30 tablet 1   zinc gluconate 50 MG tablet Take 50 mg by mouth daily.     No current facility-administered medications for this visit.    Family History  Problem Relation Age of Onset   Cancer Sister        non-hodgkins lymphoma    Review of Systems  Exam:   LMP 02/24/1991     General appearance: alert, cooperative and appears stated age Head: normocephalic, without obvious abnormality, atraumatic Neck: no adenopathy, supple, symmetrical, trachea midline and thyroid normal to inspection and palpation Lungs: clear to auscultation bilaterally Breasts: normal appearance, no masses or tenderness, No nipple retraction or dimpling, No nipple discharge or bleeding, No axillary adenopathy Heart: regular rate and rhythm Abdomen: soft, non-tender; no masses, no organomegaly Extremities: extremities normal, atraumatic, no cyanosis or edema Skin: skin color, texture, turgor normal. No rashes or lesions Lymph nodes: cervical, supraclavicular, and axillary nodes normal. Neurologic: grossly normal  Pelvic: External genitalia:  no lesions  No abnormal inguinal nodes palpated.              Urethra:  normal appearing urethra with no masses, tenderness or lesions              Bartholins and Skenes: normal                 Vagina: normal appearing vagina with normal color and discharge, no lesions              Cervix: no lesions              Pap taken: {yes no:314532} Bimanual Exam:  Uterus:  normal size, contour, position, consistency, mobility, non-tender              Adnexa: no mass, fullness, tenderness              Rectal exam: {yes no:314532}.  Confirms.              Anus:  normal sphincter tone, no lesions  Chaperone was present for exam:  ***  Assessment:    Well woman visit with gynecologic exam.   Plan: Mammogram screening discussed. Self breast awareness reviewed. Pap and HR HPV as above. Guidelines for Calcium, Vitamin D, regular exercise program including cardiovascular and weight bearing exercise.   Follow up annually and prn.   Additional counseling given.  {yes [id number]. _______ minutes face to face time of which over 50% was spent in counseling.    After visit summary provided.

## 2021-06-06 ENCOUNTER — Encounter: Payer: Self-pay | Admitting: Obstetrics and Gynecology

## 2021-06-06 ENCOUNTER — Ambulatory Visit (INDEPENDENT_AMBULATORY_CARE_PROVIDER_SITE_OTHER): Payer: Medicare HMO | Admitting: Obstetrics and Gynecology

## 2021-06-06 ENCOUNTER — Other Ambulatory Visit: Payer: Self-pay

## 2021-06-06 VITALS — BP 122/74 | HR 68 | Ht 65.5 in | Wt 119.0 lb

## 2021-06-06 DIAGNOSIS — M810 Age-related osteoporosis without current pathological fracture: Secondary | ICD-10-CM | POA: Diagnosis not present

## 2021-06-06 DIAGNOSIS — B009 Herpesviral infection, unspecified: Secondary | ICD-10-CM | POA: Diagnosis not present

## 2021-06-06 DIAGNOSIS — Z9189 Other specified personal risk factors, not elsewhere classified: Secondary | ICD-10-CM

## 2021-06-06 DIAGNOSIS — Z01419 Encounter for gynecological examination (general) (routine) without abnormal findings: Secondary | ICD-10-CM

## 2021-06-06 MED ORDER — VALACYCLOVIR HCL 500 MG PO TABS: 500.0000 mg | ORAL_TABLET | Freq: Two times a day (BID) | ORAL | 1 refills | Status: DC

## 2021-06-07 NOTE — Patient Instructions (Signed)

## 2021-06-09 ENCOUNTER — Other Ambulatory Visit: Payer: Self-pay

## 2021-06-09 DIAGNOSIS — M818 Other osteoporosis without current pathological fracture: Secondary | ICD-10-CM

## 2021-06-13 ENCOUNTER — Telehealth: Payer: Self-pay

## 2021-06-13 NOTE — Telephone Encounter (Signed)
LVM instructions for pt to return call for CPE. ?

## 2021-07-10 NOTE — Telephone Encounter (Signed)
LVM instructions for pt to call back to schedule CPE at her earliest availability.  ?

## 2021-08-05 ENCOUNTER — Encounter: Payer: Self-pay | Admitting: Adult Health

## 2021-08-05 ENCOUNTER — Ambulatory Visit (INDEPENDENT_AMBULATORY_CARE_PROVIDER_SITE_OTHER): Payer: Medicare HMO | Admitting: Adult Health

## 2021-08-05 VITALS — BP 122/80 | HR 57 | Temp 98.1°F | Ht 67.0 in | Wt 121.0 lb

## 2021-08-05 DIAGNOSIS — C499 Malignant neoplasm of connective and soft tissue, unspecified: Secondary | ICD-10-CM

## 2021-08-05 DIAGNOSIS — Z8739 Personal history of other diseases of the musculoskeletal system and connective tissue: Secondary | ICD-10-CM | POA: Diagnosis not present

## 2021-08-05 DIAGNOSIS — Z0000 Encounter for general adult medical examination without abnormal findings: Secondary | ICD-10-CM

## 2021-08-05 NOTE — Progress Notes (Signed)
? ?Subjective:  ? ? Patient ID: Natalie Reyes, female    DOB: Aug 09, 1950, 71 y.o.   MRN: [id number] ? ?HPI ?Patient presents for yearly preventative medicine examination. 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 ? ?H.o of liposarcoma of left thigh - S/p resection and radiation therapy  ? ?H/o of osteoporosis - not currently treated with prescription medication. Has pending bone density screen at GYN  ? ? ?All immunizations and health maintenance protocols were reviewed with the patient and needed orders were placed. Refused all vaccinations  ? ?Appropriate screening laboratory values were ordered for the patient including screening of hyperlipidemia, renal function and hepatic function. ? ?Medication reconciliation,  past medical history, social history, problem list and allergies were reviewed in detail with the patient ? ?Goals were established with regard to weight loss, exercise, and  diet in compliance with medications ?Wt Readings from Last 3 Encounters:  ?08/05/21 121 lb (54.9 kg)  ?06/06/21 119 lb (54 kg)  ?01/03/21 122 lb (55.3 kg)  ? ?She does not want to have a colonoscopy at this point - will let us know. Refuses cologuard ? ?Review of Systems  ?Constitutional: Negative.   ?HENT: Negative.    ?Eyes: Negative.   ?Respiratory: Negative.    ?Cardiovascular: Negative.   ?Gastrointestinal: Negative.   ?Endocrine: Negative.   ?Genitourinary: Negative.   ?Musculoskeletal: Negative.   ?Skin: Negative.   ?Allergic/Immunologic: Negative.   ?Neurological: Negative.   ?Hematological: Negative.   ?Psychiatric/Behavioral: Negative.    ? ?Past Medical History:  ?Diagnosis Date  ? Anxiety   ?  Arterial thrombosis ([place])   ? Colon cancer screening   ? Depressive disorder   ? Estrogen deficiency   ? Heart murmur   ? mitral valve prolapse  ? HSV-2 (herpes simplex virus 2) infection 2006  ? Irregular heartbeat   ? Myxoid liposarcoma   ? 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 Osteoporosis 01/2008  ? -3.7  ? Other specified coagulation defects ([place])   ? ? ?Social History  ? ?Socioeconomic History  ? Marital status: Divorced  ?  Spouse name: Not on file  ? Number of children: Not on file  ? Years of education: Not on file  ? Highest education level: Not on file  ?Occupational History  ? Not on file  ?Tobacco Use  ? Smoking status: Never  ? Smokeless tobacco: Never  ?Vaping Use  ? Vaping Use: Never used  ?Substance and Sexual Activity  ? Alcohol use: Yes  ?  Alcohol/week: 5.0 standard drinks  ?  Types: 5 Glasses of wine per week  ? Drug use: No  ? Sexual activity: Not Currently  ?  Partners: Male  ?  Birth control/protection: Surgical  ?  Comment: hyst-1st intercourse 71 yo-More than 5 partners  ?Other Topics Concern  ? Not on file  ?Social History Narrative  ?   ? ?Social Determinants of Health  ? ?Financial Resource Strain: Not on file  ?Food Insecurity: Not on file  ?Transportation Needs: Not on file  ?Physical Activity: Not on file  ?Stress: Not on file  ?Social Connections: Not on file  ?Intimate Partner Violence: Not on file  ? ? ?Past Surgical History:  ?Procedure Laterality Date  ? arterial vascular grafting    ?  AUGMENTATION MAMMAPLASTY    ? BREAST SURGERY    ? breast implants  ? mass removal sacral area    ? Radiation for liposarcoma    ? removal liposarcoma    ? VAGINAL HYSTERECTOMY  1995  ? ? ?Family History  ?Problem Relation Age of Onset  ? Cancer Sister   ?     non-hodgkins lymphoma  ? ? ?No Known Allergies ? ?Current Outpatient Medications on File Prior to Visit  ?Medication Sig Dispense Refill  ? Ascorbic Acid (VITAMIN C) 1000 MG tablet Take 1,000 mg by mouth daily.    ? Cholecalciferol (VITAMIN D3) 50 MCG  (2000 UT) CAPS Take 1 capsule by mouth daily.    ? metoprolol succinate (TOPROL XL) 25 MG 24 hr tablet Take 1 tablet (25 mg total) by mouth daily. Start taking 1/2 tablet by mouth daily.  After 2 weeks increase to one tablet by mouth daily. 90 tablet 3  ? Multiple Vitamin (MULTIVITAMIN) capsule Take 1 capsule by mouth daily. A dose of immunity    ? OIL OF OREGANO PO Take by mouth.    ? OVER THE COUNTER MEDICATION Quercitin--2 tablets daily    ? TURMERIC PO Take by mouth.    ? valACYclovir (VALTREX) 500 MG tablet Take 1 tablet (500 mg total) by mouth 2 (two) times daily. Take one tablet BID at onset of symptoms for 3 days. 30 tablet 1  ? zinc gluconate 50 MG tablet Take 50 mg by mouth daily.    ? ?No current facility-administered medications on file prior to visit.  ? ? ?BP 122/80   Temp 98.1 ?F (36.7 ?C) (Oral)   Ht '5\' 7"'$  (1.702 m)   Wt 121 lb (54.9 kg)   LMP 02/24/1991   BMI 18.95 kg/m?  ? ? ?   ?Objective:  ? Physical Exam ?Vitals and nursing note reviewed.  ?Constitutional:   ?   General: She is not in acute distress. ?   Appearance: Normal appearance. She is well-developed. She is not ill-appearing.  ?HENT:  ?   Head: Normocephalic and atraumatic.  ?   Right Ear: Tympanic membrane, ear canal and external ear normal. There is no impacted cerumen.  ?   Left Ear: Tympanic membrane, ear canal and external ear normal. There is no impacted cerumen.  ?   Nose: Nose normal. No congestion or rhinorrhea.  ?   Mouth/Throat:  ?   Mouth: Mucous membranes are moist.  ?   Pharynx: Oropharynx is clear. No oropharyngeal exudate or posterior oropharyngeal erythema.  ?Eyes:  ?   General:     ?   Right eye: No discharge.     ?   Left eye: No discharge.  ?   Extraocular Movements: Extraocular movements intact.  ?   Conjunctiva/sclera: Conjunctivae normal.  ?   Pupils: Pupils are equal, round, and reactive to light.  ?Neck:  ?   Thyroid: No thyromegaly.  ?   Vascular: No carotid bruit.  ?   Trachea: No tracheal deviation.   ?Cardiovascular:  ?   Rate and Rhythm: Normal rate and regular rhythm.  ?   Pulses: Normal pulses.  ?   Heart sounds: Normal heart sounds. No murmur heard. ?  No friction rub. No gallop.  ?Pulmonary:  ?   Effort: Pulmonary effort is normal. No respiratory distress.  ?   Breath sounds: Normal breath sounds. No stridor. No wheezing, rhonchi or rales.  ?Chest:  ?   Chest wall: No  tenderness.  ?Abdominal:  ?   General: Abdomen is flat. Bowel sounds are normal. There is no distension.  ?   Palpations: Abdomen is soft. There is no mass.  ?   Tenderness: There is no abdominal tenderness. There is no right CVA tenderness, left CVA tenderness, guarding or rebound.  ?   Hernia: No hernia is present.  ?Musculoskeletal:     ?   General: No swelling, tenderness, deformity or signs of injury. Normal range of motion.  ?   Cervical back: Normal range of motion and neck supple.  ?   Right lower leg: No edema.  ?   Left lower leg: No edema.  ?Lymphadenopathy:  ?   Cervical: No cervical adenopathy.  ?Skin: ?   General: Skin is warm and dry.  ?   Coloration: Skin is not jaundiced or pale.  ?   Findings: No bruising, erythema, lesion or rash.  ?Neurological:  ?   General: No focal deficit present.  ?   Mental Status: She is alert and oriented to person, place, and time.  ?   Cranial Nerves: No cranial nerve deficit.  ?   Sensory: No sensory deficit.  ?   Motor: No weakness.  ?   Coordination: Coordination normal.  ?   Gait: Gait normal.  ?   Deep Tendon Reflexes: Reflexes normal.  ?Psychiatric:     ?   Mood and Affect: Mood normal.     ?   Behavior: Behavior normal.     ?   Thought Content: Thought content normal.     ?   Judgment: Judgment normal.  ? ?   ?Assessment & Plan:  ?1. Routine general medical examination at a health care facility ?- Follow up in one year or sooner if needd ? Encouraged routine exercise ?- Understands her risks with not doing routine colon cancer screening.  ?- CBC with Differential/Platelet; Future ?-  Comprehensive metabolic panel; Future ?- Lipid panel; Future ?- TSH; Future ? ?2. History of osteoporosis ?- Follow up for bone density screen  ? ?3. Myxoid liposarcoma (HCC) ? ? ?Dorothyann Peng, NP ? ?

## 2021-08-21 ENCOUNTER — Other Ambulatory Visit (INDEPENDENT_AMBULATORY_CARE_PROVIDER_SITE_OTHER): Payer: Medicare HMO

## 2021-08-21 DIAGNOSIS — Z0000 Encounter for general adult medical examination without abnormal findings: Secondary | ICD-10-CM

## 2021-08-21 LAB — CBC WITH DIFFERENTIAL/PLATELET
Basophils Absolute: 0 10*3/uL (ref 0.0–0.1)
Basophils Relative: 0.9 % (ref 0.0–3.0)
Eosinophils Absolute: 0.1 10*3/uL (ref 0.0–0.7)
Eosinophils Relative: 2.1 % (ref 0.0–5.0)
HCT: 41.3 % (ref 36.0–46.0)
Hemoglobin: 13.6 g/dL (ref 12.0–15.0)
Lymphocytes Relative: 34.2 % (ref 12.0–46.0)
Lymphs Abs: 1.6 10*3/uL (ref 0.7–4.0)
MCHC: 32.9 g/dL (ref 30.0–36.0)
MCV: 88.5 fl (ref 78.0–100.0)
Monocytes Absolute: 0.4 10*3/uL (ref 0.1–1.0)
Monocytes Relative: 9.3 % (ref 3.0–12.0)
Neutro Abs: 2.5 10*3/uL (ref 1.4–7.7)
Neutrophils Relative %: 53.5 % (ref 43.0–77.0)
Platelets: 190 10*3/uL (ref 150.0–400.0)
RBC: 4.67 Mil/uL (ref 3.87–5.11)
RDW: 13.8 % (ref 11.5–15.5)
WBC: 4.7 10*3/uL (ref 4.0–10.5)

## 2021-08-21 LAB — COMPREHENSIVE METABOLIC PANEL
ALT: 16 U/L (ref 0–35)
AST: 23 U/L (ref 0–37)
Albumin: 4.5 g/dL (ref 3.5–5.2)
Alkaline Phosphatase: 57 U/L (ref 39–117)
BUN: 14 mg/dL (ref 6–23)
CO2: 28 mEq/L (ref 19–32)
Calcium: 9.4 mg/dL (ref 8.4–10.5)
Chloride: 102 mEq/L (ref 96–112)
Creatinine, Ser: 0.72 mg/dL (ref 0.40–1.20)
GFR: 85.87 mL/min (ref >60.00)
Glucose, Bld: 113 mg/dL — ABNORMAL HIGH (ref 70–99)
Potassium: 3.8 mEq/L (ref 3.5–5.1)
Sodium: 140 mEq/L (ref 135–145)
Total Bilirubin: 1.3 mg/dL — ABNORMAL HIGH (ref 0.2–1.2)
Total Protein: 7 g/dL (ref 6.0–8.3)

## 2021-08-21 LAB — LIPID PANEL
Cholesterol: 196 mg/dL (ref 0–200)
HDL: 62.5 mg/dL (ref >39.00)
LDL Cholesterol: 118 mg/dL — ABNORMAL HIGH (ref 0–99)
NonHDL: 133.13
Total CHOL/HDL Ratio: 3
Triglycerides: 77 mg/dL (ref 0.0–149.0)
VLDL: 15.4 mg/dL (ref 0.0–40.0)

## 2021-08-21 LAB — TSH: TSH: 3.14 u[IU]/mL (ref 0.35–5.50)

## 2021-09-17 ENCOUNTER — Telehealth: Payer: Self-pay | Admitting: Adult Health

## 2021-09-17 NOTE — Telephone Encounter (Signed)
Spoke with patient to schedule Medicare Annual Wellness Visit (AWV) either virtually or in office.   Patient declined stating she would call if she changes her mind and wants appt     awvi 02/21/19 per [facility]  please schedule at anytime with [facility] Nurse Health Advisor 1 or 2   This should be a 45 minute visit.

## 2021-09-29 ENCOUNTER — Telehealth: Payer: Self-pay | Admitting: Adult Health

## 2021-09-29 NOTE — Telephone Encounter (Signed)
Pt has additional questions regarding past labs, specifically her vitamin D. Requests a call

## 2021-09-30 NOTE — Telephone Encounter (Signed)
Lm to return call.

## 2021-10-01 NOTE — Telephone Encounter (Signed)
Pt wanted to know the results of her Vit D. I advised pt that Vit D lab was not ordered unless pt requested it. Pt stated that she was just curious about her levels but are not having any sx. I advised pt if it was okay with [facility] we can place these orders. Pt declined and stated she may get it another time. No further actions needed.

## 2021-10-27 DIAGNOSIS — H43811 Vitreous degeneration, right eye: Secondary | ICD-10-CM | POA: Diagnosis not present

## 2021-12-11 DIAGNOSIS — D2371 Other benign neoplasm of skin of right lower limb, including hip: Secondary | ICD-10-CM | POA: Diagnosis not present

## 2021-12-11 DIAGNOSIS — L814 Other melanin hyperpigmentation: Secondary | ICD-10-CM | POA: Diagnosis not present

## 2021-12-11 DIAGNOSIS — D225 Melanocytic nevi of trunk: Secondary | ICD-10-CM | POA: Diagnosis not present

## 2021-12-11 DIAGNOSIS — D2261 Melanocytic nevi of right upper limb, including shoulder: Secondary | ICD-10-CM | POA: Diagnosis not present

## 2021-12-11 DIAGNOSIS — H43811 Vitreous degeneration, right eye: Secondary | ICD-10-CM | POA: Diagnosis not present

## 2021-12-11 DIAGNOSIS — D2271 Melanocytic nevi of right lower limb, including hip: Secondary | ICD-10-CM | POA: Diagnosis not present

## 2021-12-11 DIAGNOSIS — L309 Dermatitis, unspecified: Secondary | ICD-10-CM | POA: Diagnosis not present

## 2021-12-11 DIAGNOSIS — D2272 Melanocytic nevi of left lower limb, including hip: Secondary | ICD-10-CM | POA: Diagnosis not present

## 2021-12-11 DIAGNOSIS — L820 Inflamed seborrheic keratosis: Secondary | ICD-10-CM | POA: Diagnosis not present

## 2021-12-11 DIAGNOSIS — L821 Other seborrheic keratosis: Secondary | ICD-10-CM | POA: Diagnosis not present

## 2021-12-11 DIAGNOSIS — D2262 Melanocytic nevi of left upper limb, including shoulder: Secondary | ICD-10-CM | POA: Diagnosis not present

## 2021-12-18 DIAGNOSIS — Z0100 Encounter for examination of eyes and vision without abnormal findings: Secondary | ICD-10-CM | POA: Diagnosis not present

## 2022-01-16 DIAGNOSIS — H04203 Unspecified epiphora, bilateral lacrimal glands: Secondary | ICD-10-CM | POA: Diagnosis not present

## 2022-01-16 DIAGNOSIS — Z0100 Encounter for examination of eyes and vision without abnormal findings: Secondary | ICD-10-CM | POA: Diagnosis not present

## 2022-02-10 DIAGNOSIS — H04203 Unspecified epiphora, bilateral lacrimal glands: Secondary | ICD-10-CM | POA: Diagnosis not present

## 2022-04-10 DIAGNOSIS — L988 Other specified disorders of the skin and subcutaneous tissue: Secondary | ICD-10-CM | POA: Diagnosis not present

## 2022-08-12 DIAGNOSIS — H5213 Myopia, bilateral: Secondary | ICD-10-CM | POA: Diagnosis not present

## 2022-08-12 DIAGNOSIS — H2511 Age-related nuclear cataract, right eye: Secondary | ICD-10-CM | POA: Diagnosis not present

## 2022-08-12 DIAGNOSIS — H43811 Vitreous degeneration, right eye: Secondary | ICD-10-CM | POA: Diagnosis not present

## 2022-08-12 DIAGNOSIS — H25011 Cortical age-related cataract, right eye: Secondary | ICD-10-CM | POA: Diagnosis not present

## 2022-12-15 DIAGNOSIS — Z85828 Personal history of other malignant neoplasm of skin: Secondary | ICD-10-CM | POA: Diagnosis not present

## 2022-12-15 DIAGNOSIS — D2372 Other benign neoplasm of skin of left lower limb, including hip: Secondary | ICD-10-CM | POA: Diagnosis not present

## 2022-12-15 DIAGNOSIS — L821 Other seborrheic keratosis: Secondary | ICD-10-CM | POA: Diagnosis not present

## 2022-12-15 DIAGNOSIS — D225 Melanocytic nevi of trunk: Secondary | ICD-10-CM | POA: Diagnosis not present

## 2023-03-10 DIAGNOSIS — Z0100 Encounter for examination of eyes and vision without abnormal findings: Secondary | ICD-10-CM | POA: Diagnosis not present

## 2023-04-13 ENCOUNTER — Telehealth: Payer: Self-pay | Admitting: Adult Health

## 2023-04-13 NOTE — Telephone Encounter (Signed)
Lmom for pt to sch ov or cpe

## 2023-08-18 DIAGNOSIS — H25013 Cortical age-related cataract, bilateral: Secondary | ICD-10-CM | POA: Diagnosis not present

## 2023-08-18 DIAGNOSIS — H2513 Age-related nuclear cataract, bilateral: Secondary | ICD-10-CM | POA: Diagnosis not present

## 2023-08-18 DIAGNOSIS — H5213 Myopia, bilateral: Secondary | ICD-10-CM | POA: Diagnosis not present

## 2024-01-06 NOTE — Progress Notes (Signed)
 Natalie Reyes                                          MRN: [id number]   01/06/2024   The [facility] Quality Team Specialist reviewed this patient medical record for the purposes of chart review for care gap closure. The following were reviewed: chart review for care gap closure-breast cancer screening and colorectal cancer screening.    [facility] Quality Team

## 2024-01-19 ENCOUNTER — Telehealth: Payer: Self-pay | Admitting: *Deleted

## 2024-01-19 NOTE — Telephone Encounter (Signed)
 Copied from CRM [id number]. Topic: General - Other >> Jan 19, 2024  1:32 PM Aleatha C wrote: Reason for CRM: Patient returning [place] about appointment change schedule 12/5 for physical but wanted to see if the 11/19 was available like the message said

## 2024-01-20 NOTE — Telephone Encounter (Signed)
 Left detailed message informing pt that there are no more openings for a CPE. Pt encourage to return call to check for cancellation around her appt. Time.

## 2024-01-24 ENCOUNTER — Ambulatory Visit

## 2024-01-26 DIAGNOSIS — Z124 Encounter for screening for malignant neoplasm of cervix: Secondary | ICD-10-CM | POA: Diagnosis not present

## 2024-01-26 DIAGNOSIS — Z01419 Encounter for gynecological examination (general) (routine) without abnormal findings: Secondary | ICD-10-CM | POA: Diagnosis not present

## 2024-01-26 DIAGNOSIS — Z681 Body mass index (BMI) 19 or less, adult: Secondary | ICD-10-CM | POA: Diagnosis not present

## 2024-01-31 ENCOUNTER — Ambulatory Visit

## 2024-02-01 LAB — HM PAP SMEAR

## 2024-02-25 ENCOUNTER — Ambulatory Visit: Admitting: Adult Health

## 2024-02-25 ENCOUNTER — Encounter: Payer: Self-pay | Admitting: Adult Health

## 2024-02-25 VITALS — BP 120/80 | HR 72 | Temp 97.7°F | Ht 67.0 in | Wt 110.0 lb

## 2024-02-25 DIAGNOSIS — R1031 Right lower quadrant pain: Secondary | ICD-10-CM

## 2024-02-25 DIAGNOSIS — M810 Age-related osteoporosis without current pathological fracture: Secondary | ICD-10-CM | POA: Diagnosis not present

## 2024-02-25 DIAGNOSIS — Z136 Encounter for screening for cardiovascular disorders: Secondary | ICD-10-CM | POA: Diagnosis not present

## 2024-02-25 DIAGNOSIS — Z1211 Encounter for screening for malignant neoplasm of colon: Secondary | ICD-10-CM

## 2024-02-25 DIAGNOSIS — Z0000 Encounter for general adult medical examination without abnormal findings: Secondary | ICD-10-CM | POA: Diagnosis not present

## 2024-02-25 NOTE — Progress Notes (Signed)
 Subjective:    Patient ID: Natalie Reyes, female    DOB: 05/15/1950, 73 y.o.   MRN: [id number]  HPI Patient presents for yearly preventative medicine examination. She is a 73 year old female who  has a past medical history of Anxiety, Arterial thrombosis (HCC), Colon cancer screening, Depressive disorder, Estrogen deficiency, Heart murmur, HSV-2 (herpes simplex virus 2) infection (2006), Irregular heartbeat, Myxoid liposarcoma, Myxoliposarcoma (HCC), Osteoporosis (01/2008), and Other specified coagulation defects.  She was last seen in the office on 07/2021  H/o of osteoporosis - She has not had a recent bone density screen and refuses to have one done. She does not want treatment for osteoporosis.   Right sided pain - reports that she was turning over in bed not to long ago and felt a mass on her right lower abdomen that was painful. She did recently go see her Gyn who has ordered an US  that will be done early next week.   All immunizations and health maintenance protocols were reviewed with the patient and needed orders were placed. Refused vaccinations.   Appropriate screening laboratory values were ordered for the patient including screening of hyperlipidemia, renal function and hepatic function.   Medication reconciliation,  past medical history, social history, problem list and allergies were reviewed in detail with the patient  Goals were established with regard to weight loss, exercise, and  diet in compliance with medications Wt Readings from Last 3 Encounters:  02/25/24 110 lb (49.9 kg)  08/05/21 121 lb (54.9 kg)  06/06/21 119 lb (54 kg)   She has refused colon cancer screening in the past but is willing to do it this year.    Review of Systems  Constitutional: Negative.   HENT: Negative.    Eyes: Negative.   Respiratory: Negative.    Cardiovascular: Negative.   Gastrointestinal: Negative.   Endocrine: Negative.   Genitourinary: Negative.   Musculoskeletal:  Negative.   Skin: Negative.   Allergic/Immunologic: Negative.   Neurological: Negative.   Hematological: Negative.   Psychiatric/Behavioral: Negative.     Past Medical History:  Diagnosis Date   Anxiety    Arterial thrombosis (HCC)    Colon cancer screening    Depressive disorder    Estrogen deficiency    Heart murmur    mitral valve prolapse   HSV-2 (herpes simplex virus 2) infection 2006   Irregular heartbeat    Myxoid liposarcoma    Myxoliposarcoma (HCC)    Osteoporosis 01/2008   -3.7   Other specified coagulation defects     Social History   Socioeconomic History   Marital status: Divorced    Spouse name: Not on file   Number of children: Not on file   Years of education: Not on file   Highest education level: Not on file  Occupational History   Not on file  Tobacco Use   Smoking status: Never   Smokeless tobacco: Never  Vaping Use   Vaping status: Never Used  Substance and Sexual Activity   Alcohol use: Yes    Alcohol/week: 5.0 standard drinks of alcohol    Types: 5 Glasses of wine per week   Drug use: No   Sexual activity: Not Currently    Partners: Male    Birth control/protection: Surgical    Comment: hyst-1st intercourse 73 yo-More than 5 partners  Other Topics Concern   Not on file  Social History Narrative      Social Drivers of Health   Financial Resource Strain: Not  on file  Food Insecurity: Not on file  Transportation Needs: Not on file  Physical Activity: Not on file  Stress: Not on file  Social Connections: Not on file  Intimate Partner Violence: Not on file    Past Surgical History:  Procedure Laterality Date   arterial vascular grafting     AUGMENTATION MAMMAPLASTY     BREAST SURGERY     breast implants   mass removal sacral area     Radiation for liposarcoma     removal liposarcoma     VAGINAL HYSTERECTOMY  1995    Family History  Problem Relation Age of Onset   Cancer Sister        non-hodgkins lymphoma    No Known  Allergies  Current Outpatient Medications on File Prior to Visit  Medication Sig Dispense Refill   Multiple Vitamin (MULTIVITAMIN) capsule Take 1 capsule by mouth daily. A dose of immunity     OVER THE COUNTER MEDICATION Quercitin--2 tablets daily     No current facility-administered medications on file prior to visit.    BP 120/80   Pulse 72   Temp 97.7 F (36.5 C) (Oral)   Ht 5' 7 (1.702 m)   Wt 110 lb (49.9 kg)   LMP 02/24/1991   SpO2 96%   BMI 17.23 kg/m       Objective:   Physical Exam Vitals and nursing note reviewed.  Constitutional:      General: She is not in acute distress.    Appearance: Normal appearance. She is not ill-appearing.  HENT:     Head: Normocephalic and atraumatic.     Right Ear: Tympanic membrane, ear canal and external ear normal. There is no impacted cerumen.     Left Ear: Tympanic membrane, ear canal and external ear normal. There is no impacted cerumen.     Nose: Nose normal. No congestion or rhinorrhea.     Mouth/Throat:     Mouth: Mucous membranes are moist.     Pharynx: Oropharynx is clear.  Eyes:     Extraocular Movements: Extraocular movements intact.     Conjunctiva/sclera: Conjunctivae normal.     Pupils: Pupils are equal, round, and reactive to light.  Neck:     Vascular: No carotid bruit.  Cardiovascular:     Rate and Rhythm: Normal rate and regular rhythm.     Pulses: Normal pulses.     Heart sounds: No murmur heard.    No friction rub. No gallop.  Pulmonary:     Effort: Pulmonary effort is normal.     Breath sounds: Normal breath sounds.  Abdominal:     General: Abdomen is flat. Bowel sounds are normal. There is no distension.     Palpations: Abdomen is soft. There is no mass.     Tenderness: There is no abdominal tenderness. There is no guarding or rebound.     Hernia: No hernia is present.   Musculoskeletal:        General: Normal range of motion.     Cervical back: Normal range of motion and neck supple.   Lymphadenopathy:     Cervical: No cervical adenopathy.  Skin:    General: Skin is warm and dry.     Capillary Refill: Capillary refill takes less than 2 seconds.  Neurological:     General: No focal deficit present.     Mental Status: She is alert and oriented to person, place, and time.  Psychiatric:  Mood and Affect: Mood normal.        Behavior: Behavior normal.        Thought Content: Thought content normal.        Judgment: Judgment normal.       Assessment & Plan:  1. Routine general medical examination at a health care facility (Primary) Today patient counseled on age appropriate routine health concerns for screening and prevention, each reviewed and up to date or declined. Immunizations reviewed and up to date or declined. Labs ordered and reviewed. Risk factors for depression reviewed and negative. Hearing function and visual acuity are intact. ADLs screened and addressed as needed. Functional ability and level of safety reviewed and appropriate. Education, counseling and referrals performed based on assessed risks today. Patient provided with a copy of personalized plan for preventive services.   2. Age-related osteoporosis without current pathological fracture - If she changes her mind and wants a bone density screen done and/or tx she will let me know  - VITAMIN D  25 Hydroxy (Vit-D Deficiency, Fractures); Future  3. Colon cancer screening  - Ambulatory referral to Gastroenterology  4. Encounter for screening for cardiovascular disorders  - CBC with Differential/Platelet; Future - Comprehensive metabolic panel with GFR; Future - Lipid panel; Future - TSH; Future - Urinalysis; Future  5. Right lower quadrant abdominal pain - Will wait for US  results.  - Consider CT if needed - CBC with Differential/Platelet; Future - Comprehensive metabolic panel with GFR; Future - Lipid panel; Future - TSH; Future - Urinalysis; Future  Darleene Shape, NP

## 2024-02-28 DIAGNOSIS — R1031 Right lower quadrant pain: Secondary | ICD-10-CM | POA: Diagnosis not present

## 2024-03-02 ENCOUNTER — Ambulatory Visit: Admitting: Adult Health

## 2024-03-02 DIAGNOSIS — M810 Age-related osteoporosis without current pathological fracture: Secondary | ICD-10-CM | POA: Diagnosis not present

## 2024-03-02 DIAGNOSIS — R1031 Right lower quadrant pain: Secondary | ICD-10-CM

## 2024-03-02 DIAGNOSIS — Z136 Encounter for screening for cardiovascular disorders: Secondary | ICD-10-CM

## 2024-03-02 NOTE — Progress Notes (Signed)
 Subjective:    Patient ID: Natalie Reyes, female    DOB: [birth year], 73 y.o.   MRN: [id number]  HPI  Discussed the use of AI scribe software for clinical note transcription with the patient, who gave verbal consent to proceed.  History of Present Illness   Natalie Reyes is a 73 year old female who presents with right-sided abdominal pain.  She has had right-sided abdominal pain for about one month, described as burning and aching, worse with walking, and sometimes radiating to her back. The pain began after she rolled over in bed and was initially more severe but has improved over the past week. Ultrasound done by her GYN was unremarkable, though it did not focus on the exact area of pain. She has no fever and is not taking medication for this pain. She feels like the pain has improved since I saw her last week; at which time she was tender with palpation to her right abdomen       Review of Systems See HPI   Past Medical History:  Diagnosis Date   Anxiety    Arterial thrombosis (HCC)    Colon cancer screening    Depressive disorder    Estrogen deficiency    Heart murmur    mitral valve prolapse   HSV-2 (herpes simplex virus 2) infection 2006   Irregular heartbeat    Myxoid liposarcoma    Myxoliposarcoma (HCC)    Osteoporosis 01/2008   -3.7   Other specified coagulation defects     Social History   Socioeconomic History   Marital status: Divorced    Spouse name: Not on file   Number of children: Not on file   Years of education: Not on file   Highest education level: Not on file  Occupational History   Not on file  Tobacco Use   Smoking status: Never   Smokeless tobacco: Never  Vaping Use   Vaping status: Never Used  Substance and Sexual Activity   Alcohol use: Yes    Alcohol/week: 5.0 standard drinks of alcohol    Types: 5 Glasses of wine per week   Drug use: No   Sexual activity: Not Currently    Partners: Male    Birth control/protection: Surgical     Comment: hyst-1st intercourse 73 yo-More than 5 partners  Other Topics Concern   Not on file  Social History Narrative      Social Drivers of Health   Tobacco Use: Low Risk (02/25/2024)   Patient History    Smoking Tobacco Use: Never    Smokeless Tobacco Use: Never    Passive Exposure: Not on file  Financial Resource Strain: Not on file  Food Insecurity: Not on file  Transportation Needs: Not on file  Physical Activity: Not on file  Stress: Not on file  Social Connections: Not on file  Intimate Partner Violence: Not on file  Depression (PHQ2-9): Low Risk (02/25/2024)   Depression (PHQ2-9)    PHQ-2 Score: 0  Alcohol Screen: Not on file  Housing: Not on file  Utilities: Not on file  Health Literacy: Not on file    Past Surgical History:  Procedure Laterality Date   arterial vascular grafting     AUGMENTATION MAMMAPLASTY     BREAST SURGERY     breast implants   mass removal sacral area     Radiation for liposarcoma     removal liposarcoma     VAGINAL HYSTERECTOMY  1995    Family  History  Problem Relation Age of Onset   Cancer Sister        non-hodgkins lymphoma    Allergies[1]  Medications Ordered Prior to Encounter[2]  LMP 02/24/1991       Objective:   Physical Exam Vitals and nursing note reviewed.  Constitutional:      Appearance: Normal appearance.  Abdominal:     General: Abdomen is flat. There is no distension.     Palpations: Abdomen is soft. There is no mass.     Tenderness: There is no abdominal tenderness.     Hernia: No hernia is present.  Musculoskeletal:        General: Normal range of motion.  Skin:    General: Skin is warm and dry.     Capillary Refill: Capillary refill takes less than 2 seconds.  Neurological:     General: No focal deficit present.     Mental Status: She is alert and oriented to person, place, and time.  Psychiatric:        Mood and Affect: Mood normal.        Behavior: Behavior normal.        Thought Content:  Thought content normal.        Judgment: Judgment normal.           Assessment & Plan:  Assessment and Plan    Right lower quadrant abdominal pain Intermittent pain improving, likely muscular strain. No pain with palpation today . Hernia less likely due to symptom improvement and ultrasound findings. - Consider CT abdomen without contrast if symptoms worsen or persist.      Darleene Shape, NP      [1] No Known Allergies [2]  Current Outpatient Medications on File Prior to Visit  Medication Sig Dispense Refill   Multiple Vitamin (MULTIVITAMIN) capsule Take 1 capsule by mouth daily. A dose of immunity     No current facility-administered medications on file prior to visit.

## 2024-03-03 ENCOUNTER — Other Ambulatory Visit

## 2024-03-03 ENCOUNTER — Ambulatory Visit: Payer: Self-pay | Admitting: Adult Health

## 2024-03-03 LAB — CBC WITH DIFFERENTIAL/PLATELET
Absolute Lymphocytes: 1836 {cells}/uL (ref 850–3900)
Absolute Monocytes: 558 {cells}/uL (ref 200–950)
Basophils Absolute: 48 {cells}/uL (ref 0–200)
Basophils Relative: 0.8 %
Eosinophils Absolute: 78 {cells}/uL (ref 15–500)
Eosinophils Relative: 1.3 %
HCT: 41.9 % (ref 35.9–46.0)
Hemoglobin: 13.3 g/dL (ref 11.7–15.5)
MCH: 28.1 pg (ref 27.0–33.0)
MCHC: 31.7 g/dL (ref 31.6–35.4)
MCV: 88.4 fL (ref 81.4–101.7)
MPV: 11.4 fL (ref 7.5–12.5)
Monocytes Relative: 9.3 %
Neutro Abs: 3480 {cells}/uL (ref 1500–7800)
Neutrophils Relative %: 58 %
Platelets: 213 Thousand/uL (ref 140–400)
RBC: 4.74 Million/uL (ref 3.80–5.10)
RDW: 11.9 % (ref 11.0–15.0)
Total Lymphocyte: 30.6 %
WBC: 6 Thousand/uL (ref 3.8–10.8)

## 2024-03-03 LAB — LIPID PANEL
Cholesterol: 180 mg/dL (ref <200)
HDL: 56 mg/dL (ref >=50)
LDL Cholesterol (Calc): 108 mg/dL — ABNORMAL HIGH
Non-HDL Cholesterol (Calc): 124 mg/dL (ref <130)
Total CHOL/HDL Ratio: 3.2 (calc) (ref <5.0)
Triglycerides: 74 mg/dL (ref <150)

## 2024-03-03 LAB — COMPREHENSIVE METABOLIC PANEL WITH GFR
AG Ratio: 2.5 (calc) (ref 1.0–2.5)
ALT: 18 U/L (ref 6–29)
AST: 25 U/L (ref 10–35)
Albumin: 4.8 g/dL (ref 3.6–5.1)
Alkaline phosphatase (APISO): 50 U/L (ref 37–153)
BUN: 12 mg/dL (ref 7–25)
CO2: 29 mmol/L (ref 20–32)
Calcium: 9.2 mg/dL (ref 8.6–10.4)
Chloride: 104 mmol/L (ref 98–110)
Creat: 0.61 mg/dL (ref 0.60–1.00)
Globulin: 1.9 g/dL (ref 1.9–3.7)
Glucose, Bld: 98 mg/dL (ref 65–99)
Potassium: 4.1 mmol/L (ref 3.5–5.3)
Sodium: 140 mmol/L (ref 135–146)
Total Bilirubin: 0.8 mg/dL (ref 0.2–1.2)
Total Protein: 6.7 g/dL (ref 6.1–8.1)
eGFR: 96 mL/min/1.73m2 (ref >=60)

## 2024-03-03 LAB — TSH: TSH: 2.14 m[IU]/L (ref 0.40–4.50)

## 2024-03-03 LAB — URINALYSIS
Bilirubin Urine: NEGATIVE
Glucose, UA: NEGATIVE
Hgb urine dipstick: NEGATIVE
Ketones, ur: NEGATIVE
Leukocytes,Ua: NEGATIVE
Nitrite: NEGATIVE
Protein, ur: NEGATIVE
Specific Gravity, Urine: 1.01 (ref 1.001–1.035)
pH: 5.5 (ref 5.0–8.0)

## 2024-03-03 LAB — VITAMIN D 25 HYDROXY (VIT D DEFICIENCY, FRACTURES): Vit D, 25-Hydroxy: 48 ng/mL (ref 30–100)

## 2024-04-24 ENCOUNTER — Encounter: Payer: Self-pay | Admitting: Gastroenterology

## 2024-04-25 ENCOUNTER — Ambulatory Visit: Payer: Self-pay

## 2024-04-25 ENCOUNTER — Telehealth: Payer: Self-pay

## 2024-04-25 ENCOUNTER — Telehealth: Payer: Self-pay | Admitting: Adult Health

## 2024-04-25 NOTE — Telephone Encounter (Signed)
 Copied from CRM [id number]. Topic: Clinical - Lab/Test Results >> Apr 25, 2024  1:49 PM Zy'onna H wrote: Reason for CRM: Madeline from [facility] asked when the last time the CMA spoke with the patient regarding the results, the last message received was 03/03/2024..  The patient stated she hasn't be able to get hold of anyone to go over the results or help her better understand.   Please Advise.

## 2024-04-25 NOTE — Telephone Encounter (Signed)
 Agent said the pt would like a callback to go over the test results from dec 2025 she does not know what the numbers mean

## 2024-04-26 NOTE — Progress Notes (Signed)
 Natalie Reyes                                          MRN: [id number]   04/26/2024   The [facility] Quality Team Specialist reviewed this patient medical record for the purposes of chart review for care gap closure. The following were reviewed: chart review for care gap closure-breast cancer screening.    [facility] Quality Team

## 2024-04-26 NOTE — Telephone Encounter (Signed)
 Spoke to pt and she stated someone went over her labs yesterday. I advised pt that we discuss labs already and pt stated she knows that. I asked if she needed anything else pt stated she didn't. Closing note.

## 2024-04-28 ENCOUNTER — Telehealth: Payer: Self-pay

## 2024-04-28 ENCOUNTER — Encounter: Payer: Self-pay | Admitting: Adult Health

## 2024-04-28 NOTE — Telephone Encounter (Signed)
 Copied from CRM #[id number]. Topic: Clinical - Request for Lab/Test Order >> Apr 28, 2024  4:37 PM Hadassah PARAS wrote: Reason for CRM: Pt is experiencing pain on lower right side of abdomen. This was discussed on her prev office visit. Pt would like to proceed with diagnostic testing but would like to speak w PCP regarding which has less radiation. She would like testing to be done at [facility]. Please call Pt [phone]

## 2024-04-28 NOTE — Telephone Encounter (Signed)
 Please advise.
